# Patient Record
Sex: Male | Born: 1960 | Race: Black or African American | Hispanic: No | Marital: Single | Smoking: Current every day smoker
Health system: Southern US, Community
[De-identification: ages and names within clinical notes are randomized; demographics above are authoritative.]

## PROBLEM LIST (undated history)

## (undated) HISTORY — PX: KNEE ARTHROPLASTY: SHX992

## (undated) HISTORY — PX: FOOT FRACTURE SURGERY: SHX645

---

## 1998-10-23 ENCOUNTER — Emergency Department (HOSPITAL_COMMUNITY): Admission: EM | Admit: 1998-10-23 | Discharge: 1998-10-23 | Payer: Self-pay | Admitting: Emergency Medicine

## 1998-12-25 ENCOUNTER — Emergency Department (HOSPITAL_COMMUNITY): Admission: EM | Admit: 1998-12-25 | Discharge: 1998-12-25 | Payer: Self-pay | Admitting: Emergency Medicine

## 1999-12-30 ENCOUNTER — Emergency Department (HOSPITAL_COMMUNITY): Admission: EM | Admit: 1999-12-30 | Discharge: 1999-12-30 | Payer: Self-pay | Admitting: Emergency Medicine

## 2003-01-02 ENCOUNTER — Emergency Department (HOSPITAL_COMMUNITY): Admission: EM | Admit: 2003-01-02 | Discharge: 2003-01-02 | Payer: Self-pay | Admitting: *Deleted

## 2003-08-14 ENCOUNTER — Emergency Department (HOSPITAL_COMMUNITY): Admission: EM | Admit: 2003-08-14 | Discharge: 2003-08-14 | Payer: Self-pay | Admitting: Emergency Medicine

## 2003-09-20 ENCOUNTER — Emergency Department (HOSPITAL_COMMUNITY): Admission: EM | Admit: 2003-09-20 | Discharge: 2003-09-20 | Payer: Self-pay | Admitting: Emergency Medicine

## 2005-05-22 ENCOUNTER — Emergency Department (HOSPITAL_COMMUNITY): Admission: EM | Admit: 2005-05-22 | Discharge: 2005-05-22 | Payer: Self-pay | Admitting: Emergency Medicine

## 2006-11-11 ENCOUNTER — Emergency Department (HOSPITAL_COMMUNITY): Admission: EM | Admit: 2006-11-11 | Discharge: 2006-11-11 | Payer: Self-pay | Admitting: Emergency Medicine

## 2008-10-02 IMAGING — CT CT HEAD W/O CM
1 series · 16 of 30 positions shown, 20 images · IV contrast (agent unspecified)
Comparison: 09/20/03.

CLINICAL DATA: Severe headache.
 HEAD CT WITHOUT CONTRAST:
TECHNIQUE: Contiguous axial images were obtained from the base of the skull through the vertex according to standard protocol without contrast.

[Series 2: head_seq 4.5 h37s st · axial · 0.43mm/px · z∈[-648,-504]mm · 16 of 36 slices shown, 20 images]
[im 2/36  brain]
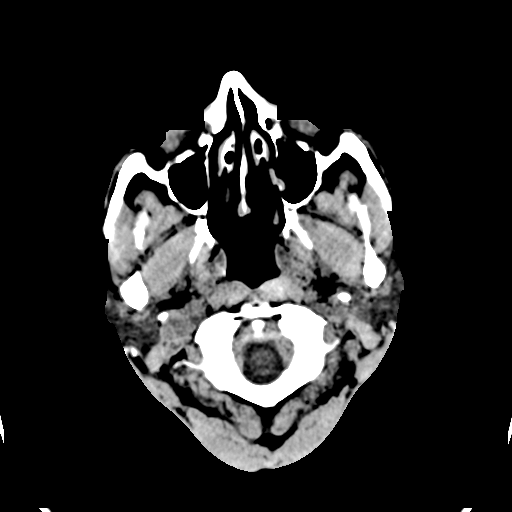
[im 2/36  bone]
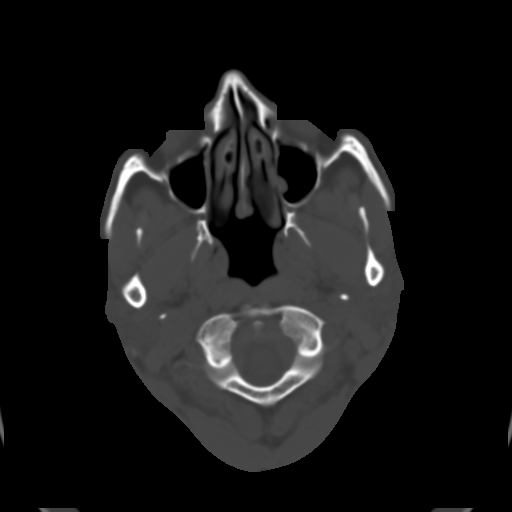
[im 4/36  brain]
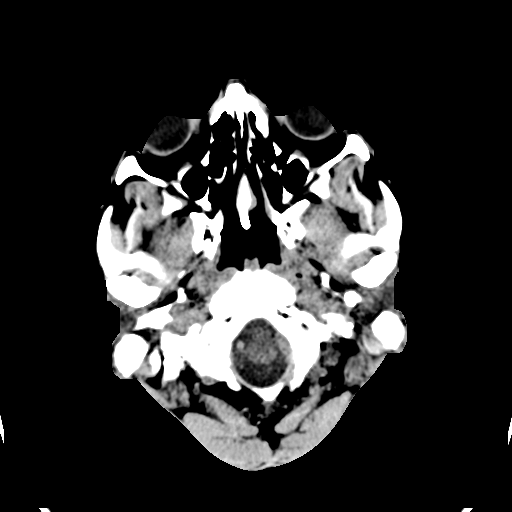
[im 7/36  brain]
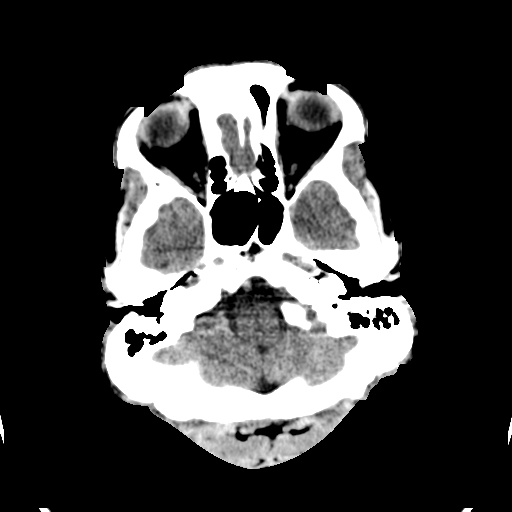
[im 9/36  brain]
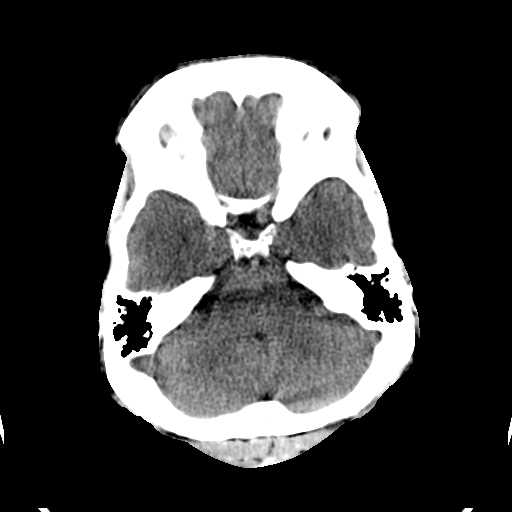
[im 10/36  brain]
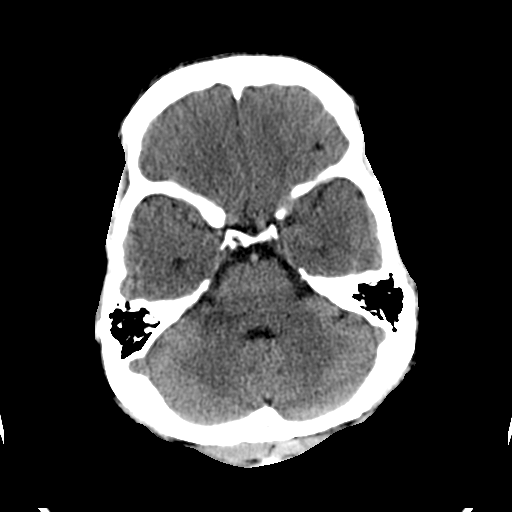
[im 10/36  bone]
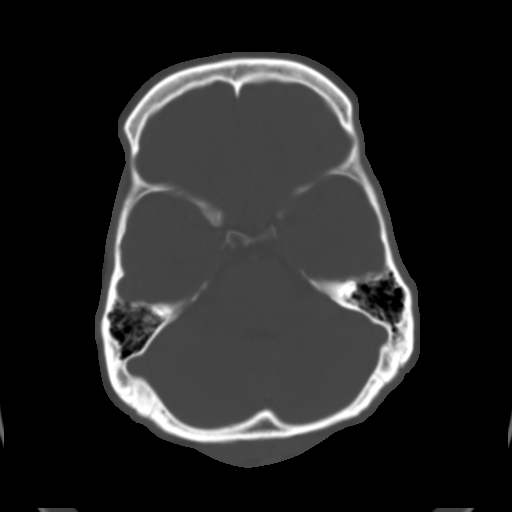
[im 13/36  brain]
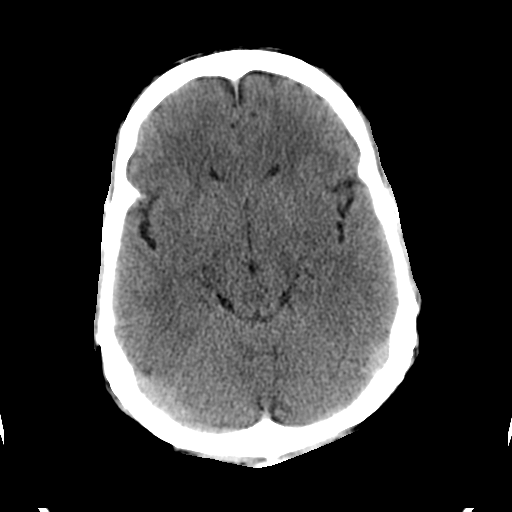
[im 15/36  brain]
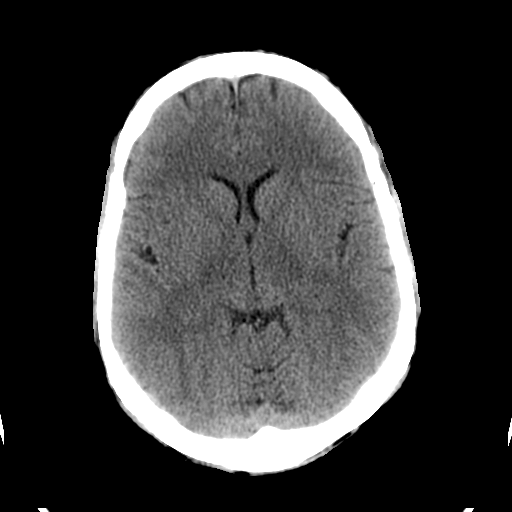
[im 17/36  brain]
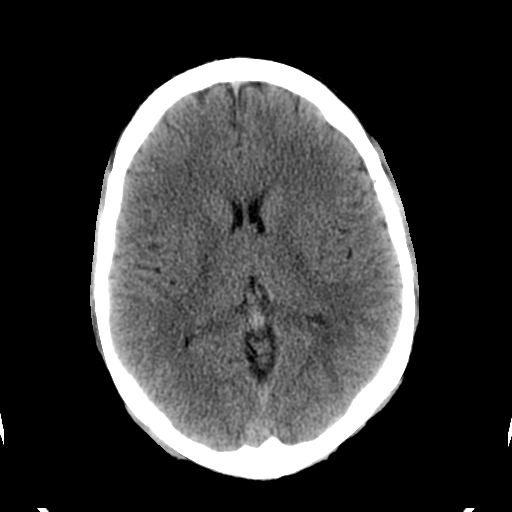
[im 19/36  brain]
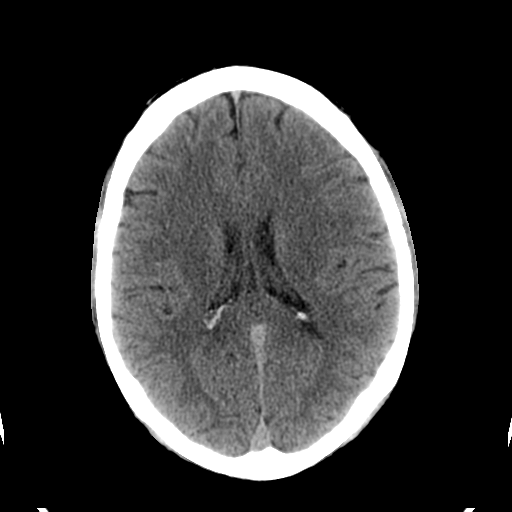
[im 19/36  bone]
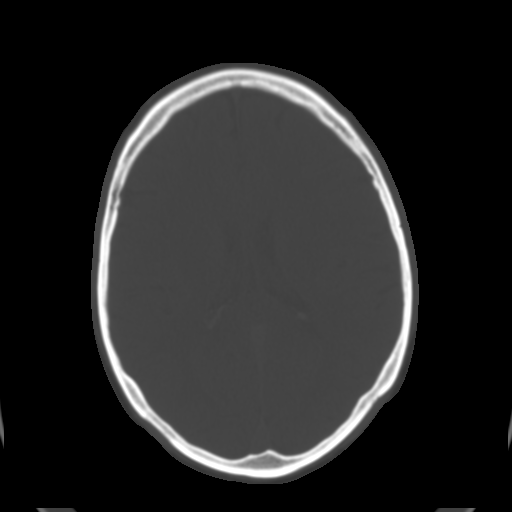
[im 21/36  brain]
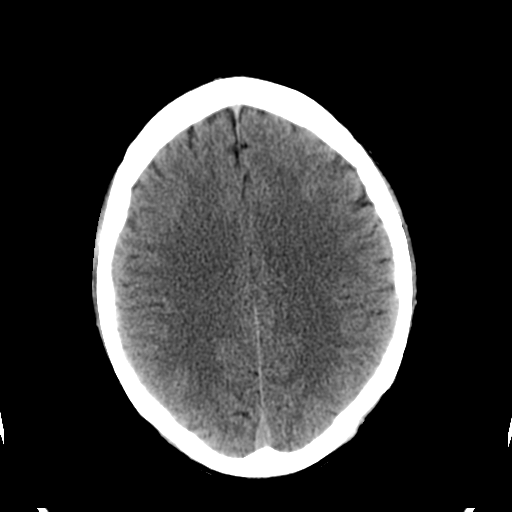
[im 23/36  brain]
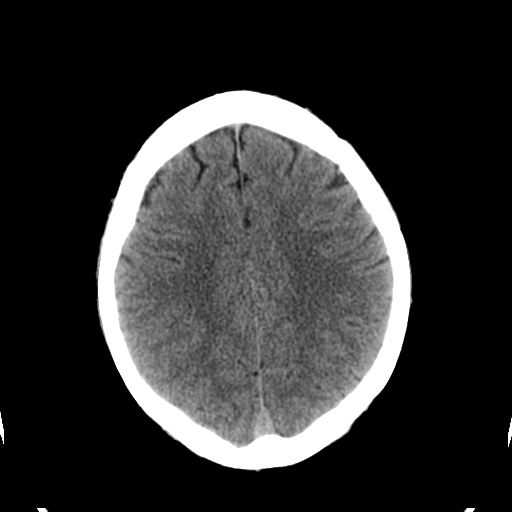
[im 26/36  brain]
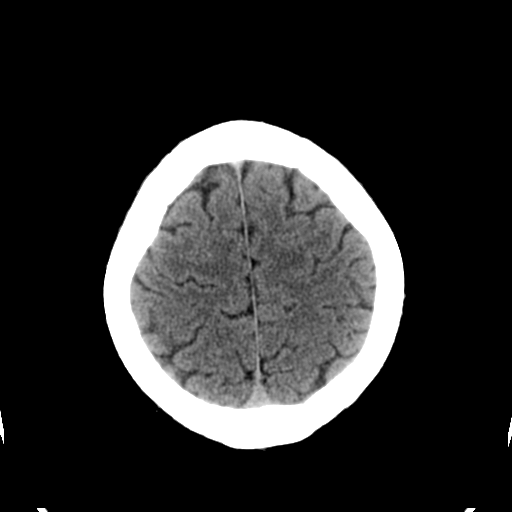
[im 27/36  brain]
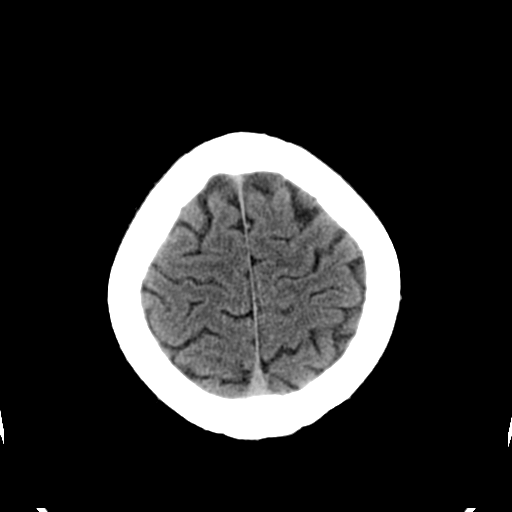
[im 27/36  bone]
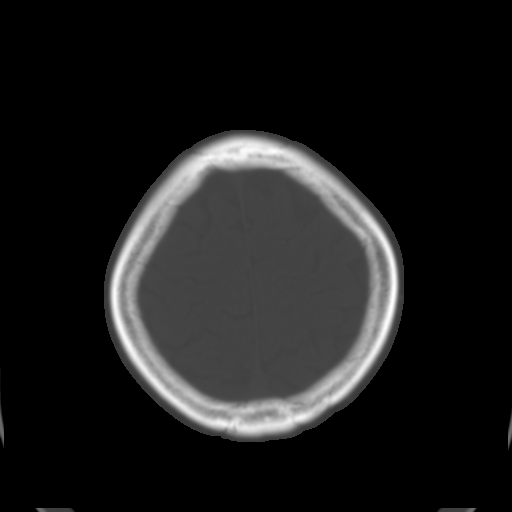
[im 29/36  brain]
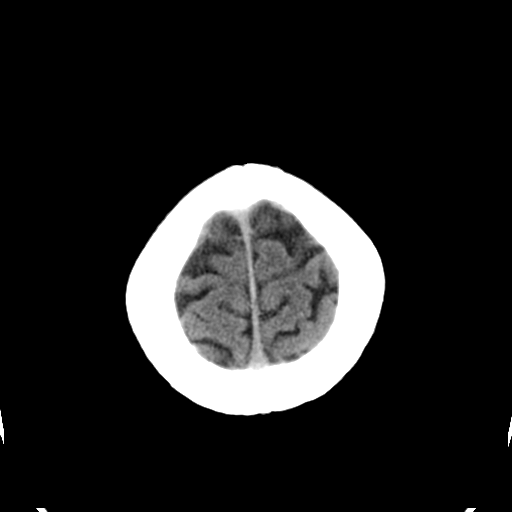
[im 32/36  brain]
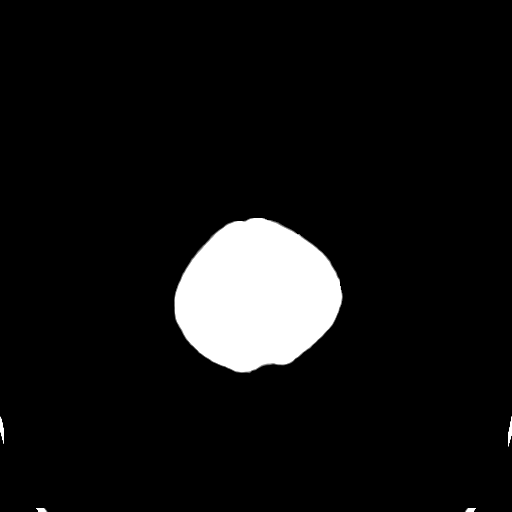
[im 34/36  brain]
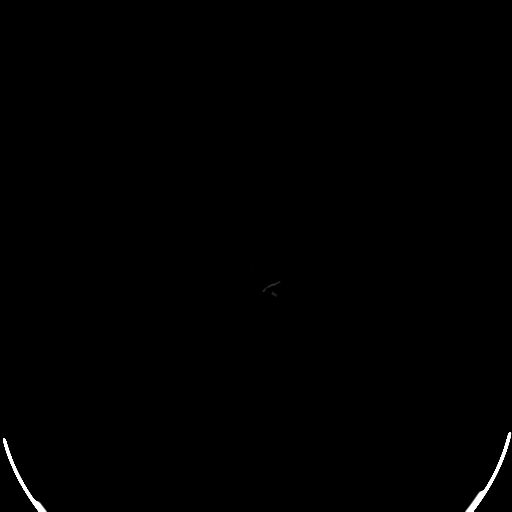

[16 of 30 positions shown; findings below may reference images not displayed]

FINDINGS: There is no evidence of intracranial hemorrhage, brain edema, acute infarct, mass lesion, or mass effect.  No other intra-axial abnormalities are seen, and the ventricles are within normal limits.  No abnormal extra-axial fluid collections or masses are identified.  No skull abnormalities are noted.
IMPRESSION: Negative non-contrast head CT.

## 2010-12-04 ENCOUNTER — Emergency Department (HOSPITAL_COMMUNITY): Payer: Self-pay

## 2010-12-04 ENCOUNTER — Emergency Department (HOSPITAL_COMMUNITY)
Admission: EM | Admit: 2010-12-04 | Discharge: 2010-12-04 | Disposition: A | Discharge status: Home / Self Care | Payer: Self-pay | Attending: Emergency Medicine | Admitting: Emergency Medicine

## 2010-12-04 DIAGNOSIS — IMO0002 Reserved for concepts with insufficient information to code with codable children: Secondary | ICD-10-CM | POA: Insufficient documentation

## 2010-12-04 DIAGNOSIS — S40029A Contusion of unspecified upper arm, initial encounter: Secondary | ICD-10-CM | POA: Insufficient documentation

## 2012-11-25 ENCOUNTER — Encounter (HOSPITAL_COMMUNITY): Payer: Self-pay | Admitting: Emergency Medicine

## 2012-11-25 ENCOUNTER — Emergency Department (HOSPITAL_COMMUNITY)
Admission: EM | Admit: 2012-11-25 | Discharge: 2012-11-25 | Disposition: A | Discharge status: Home / Self Care | Payer: Self-pay | Attending: Emergency Medicine | Admitting: Emergency Medicine

## 2012-11-25 ENCOUNTER — Emergency Department (HOSPITAL_COMMUNITY): Payer: Self-pay

## 2012-11-25 DIAGNOSIS — Y929 Unspecified place or not applicable: Secondary | ICD-10-CM | POA: Insufficient documentation

## 2012-11-25 DIAGNOSIS — IMO0002 Reserved for concepts with insufficient information to code with codable children: Secondary | ICD-10-CM | POA: Insufficient documentation

## 2012-11-25 DIAGNOSIS — F17200 Nicotine dependence, unspecified, uncomplicated: Secondary | ICD-10-CM | POA: Insufficient documentation

## 2012-11-25 DIAGNOSIS — S62309A Unspecified fracture of unspecified metacarpal bone, initial encounter for closed fracture: Secondary | ICD-10-CM | POA: Insufficient documentation

## 2012-11-25 DIAGNOSIS — S62308A Unspecified fracture of other metacarpal bone, initial encounter for closed fracture: Secondary | ICD-10-CM

## 2012-11-25 DIAGNOSIS — Y939 Activity, unspecified: Secondary | ICD-10-CM | POA: Insufficient documentation

## 2012-11-25 MED ORDER — OXYCODONE-ACETAMINOPHEN 5-325 MG PO TABS: 1.0000 | ORAL_TABLET | Freq: Four times a day (QID) | ORAL | Status: DC | PRN

## 2012-11-25 MED ORDER — OXYCODONE-ACETAMINOPHEN 5-325 MG PO TABS
2.0000 | ORAL_TABLET | Freq: Once | ORAL | Status: AC
  Administered 2012-11-25: 2 via ORAL
  Filled 2012-11-25: qty 2

## 2012-11-25 NOTE — ED Notes (Signed)
Called ortho tech to apply splint 

## 2012-11-25 NOTE — Progress Notes (Signed)
[facility] provided pt with a list of primary care resources.  °
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
## 2012-11-25 NOTE — ED Provider Notes (Signed)
Medical screening examination/treatment/procedure(s) were performed by non-physician practitioner and as supervising physician I was immediately available for consultation/collaboration.    Gilda Crease, MD 11/25/12 [phone]
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
## 2014-03-19 ENCOUNTER — Encounter (HOSPITAL_COMMUNITY): Payer: Self-pay | Admitting: Emergency Medicine

## 2014-03-19 ENCOUNTER — Emergency Department (HOSPITAL_COMMUNITY)
Admission: EM | Admit: 2014-03-19 | Discharge: 2014-03-19 | Disposition: A | Discharge status: Home / Self Care | Payer: Self-pay | Attending: Emergency Medicine | Admitting: Emergency Medicine

## 2014-03-19 DIAGNOSIS — Z720 Tobacco use: Secondary | ICD-10-CM | POA: Insufficient documentation

## 2014-03-19 DIAGNOSIS — H538 Other visual disturbances: Secondary | ICD-10-CM | POA: Insufficient documentation

## 2014-03-19 MED ORDER — PROPARACAINE HCL 0.5 % OP SOLN
1.0000 [drp] | Freq: Once | OPHTHALMIC | Status: AC
  Administered 2014-03-19: 1 [drp] via OPHTHALMIC
  Filled 2014-03-19: qty 15

## 2014-03-19 NOTE — ED Provider Notes (Signed)
CSN: [id number]     Arrival date & time 03/19/14  1426 History   First MD Initiated Contact with Patient 03/19/14 1513     Chief Complaint  Patient presents with  . Blurred Vision      HPI Patient reports of blurring of his left central vision over the past 24 hours.  Also feels like he sees an abnormal red color out of his left eye at times.  He doesn't describe specific floaters.  He does report that this began rather abruptly yesterday.  He has normal vision out of his right eye.  He denies a sensation of a black curtain coming across his eyelid.  He denies headache.  He has no eye pain.  No recent injury or trauma to his left eye.  He denies double vision.  He denies pain with extraocular movements of his left eye.  No recent discharge from his left eye.   History reviewed. No pertinent past medical history. Past Surgical History  Procedure Laterality Date  . Knee arthroplasty Left   . Foot fracture surgery Right    No family history on file. History  Substance Use Topics  . Smoking status: Current Every Day Smoker -- 1.00 packs/day    Types: Cigarettes  . Smokeless tobacco: Not on file  . Alcohol Use: Yes     Comment: occas    Review of Systems  All other systems reviewed and are negative.     Allergies  Bee venom  Home Medications   Prior to Admission medications   Medication Sig Start Date End Date Taking? Authorizing Provider  oxyCODONE-acetaminophen (PERCOCET/ROXICET) 5-325 MG per tablet Take 1 tablet by mouth every 6 (six) hours as needed for pain. Patient not taking: Reported on 03/19/2014 11/25/12   Roxy Horsemanobert Browning, PA-C   BP 111/72 mmHg  Pulse 87  Temp(Src) 98.2 F (36.8 C) (Oral)  Resp 16  SpO2 98% Physical Exam  Constitutional: He is oriented to person, place, and time. He appears well-developed and well-nourished.  HENT:  Head: Normocephalic.  Eyes: Conjunctivae and lids are normal. Pupils are equal, round, and reactive to light. Right eye exhibits  no chemosis, no discharge and no exudate. No foreign body present in the right eye. Left eye exhibits no chemosis, no discharge and no exudate. No foreign body present in the left eye. Right conjunctiva is not injected. Left conjunctiva is not injected. Right eye exhibits normal extraocular motion. Left eye exhibits normal extraocular motion.  Fundoscopic exam:      The right eye shows no hemorrhage.       The left eye shows no hemorrhage.  Intraocular pressure left eye is 10  Neck: Normal range of motion.  Pulmonary/Chest: Effort normal.  Abdominal: He exhibits no distension.  Musculoskeletal: Normal range of motion.  Neurological: He is alert and oriented to person, place, and time.  Psychiatric: He has a normal mood and affect.  Nursing note and vitals reviewed.   ED Course  Procedures (including critical care time) Labs Review Labs Reviewed - No data to display  Imaging Review No results found.   EKG Interpretation None      MDM   Final diagnoses:  Blurred vision, left eye   At this time am not convinced that this is a retinal detachment.  His extraocular pressures in his left eye are normal.  I do not think any emergent ophthalmologic procedures need to be completed today.  I have asked that he follow-up with the ophthalmologist tomorrow.  Lyanne Co, MD 03/19/14 1900

## 2014-03-19 NOTE — ED Notes (Signed)
Pt from home c/o left eye blurred vision. He reports that he has peripheral vision but looking straight head is blurry.

## 2016-06-08 ENCOUNTER — Encounter (HOSPITAL_COMMUNITY): Payer: Self-pay

## 2016-06-08 ENCOUNTER — Emergency Department (HOSPITAL_COMMUNITY): Payer: No Typology Code available for payment source

## 2016-06-08 ENCOUNTER — Emergency Department (HOSPITAL_COMMUNITY)
Admission: EM | Admit: 2016-06-08 | Discharge: 2016-06-08 | Disposition: A | Discharge status: Home / Self Care | Payer: No Typology Code available for payment source | Attending: Emergency Medicine | Admitting: Emergency Medicine

## 2016-06-08 DIAGNOSIS — Y999 Unspecified external cause status: Secondary | ICD-10-CM | POA: Insufficient documentation

## 2016-06-08 DIAGNOSIS — S299XXA Unspecified injury of thorax, initial encounter: Secondary | ICD-10-CM | POA: Insufficient documentation

## 2016-06-08 DIAGNOSIS — Z96652 Presence of left artificial knee joint: Secondary | ICD-10-CM | POA: Insufficient documentation

## 2016-06-08 DIAGNOSIS — S4991XA Unspecified injury of right shoulder and upper arm, initial encounter: Secondary | ICD-10-CM | POA: Diagnosis not present

## 2016-06-08 DIAGNOSIS — Y9389 Activity, other specified: Secondary | ICD-10-CM | POA: Insufficient documentation

## 2016-06-08 DIAGNOSIS — Y92009 Unspecified place in unspecified non-institutional (private) residence as the place of occurrence of the external cause: Secondary | ICD-10-CM | POA: Insufficient documentation

## 2016-06-08 DIAGNOSIS — F17210 Nicotine dependence, cigarettes, uncomplicated: Secondary | ICD-10-CM | POA: Insufficient documentation

## 2016-06-08 MED ORDER — OXYCODONE-ACETAMINOPHEN 5-325 MG PO TABS
ORAL_TABLET | ORAL | Status: AC
  Filled 2016-06-08: qty 1

## 2016-06-08 MED ORDER — METHOCARBAMOL 500 MG PO TABS: 500.0000 mg | ORAL_TABLET | Freq: Three times a day (TID) | ORAL | 0 refills | Status: DC | PRN

## 2016-06-08 MED ORDER — OXYCODONE-ACETAMINOPHEN 5-325 MG PO TABS
1.0000 | ORAL_TABLET | Freq: Once | ORAL | Status: AC
  Administered 2016-06-08: 1 via ORAL

## 2016-06-08 MED ORDER — IBUPROFEN 800 MG PO TABS: 800.0000 mg | ORAL_TABLET | Freq: Three times a day (TID) | ORAL | 0 refills | Status: DC

## 2016-06-08 NOTE — ED Notes (Signed)
Patient transported to X-ray 

## 2016-06-08 NOTE — Discharge Instructions (Signed)
Expect stiffness and soreness all over tomorrow. Ice to painful areas. Motrin for pain. Robaxin for muscle spasm or stiffness
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
## 2016-06-14 ENCOUNTER — Emergency Department (HOSPITAL_COMMUNITY)
Admission: EM | Admit: 2016-06-14 | Discharge: 2016-06-14 | Disposition: A | Discharge status: Home / Self Care | Payer: No Typology Code available for payment source | Attending: Emergency Medicine | Admitting: Emergency Medicine

## 2016-06-14 ENCOUNTER — Encounter (HOSPITAL_COMMUNITY): Payer: Self-pay | Admitting: Emergency Medicine

## 2016-06-14 ENCOUNTER — Emergency Department (HOSPITAL_COMMUNITY): Payer: No Typology Code available for payment source

## 2016-06-14 DIAGNOSIS — F17210 Nicotine dependence, cigarettes, uncomplicated: Secondary | ICD-10-CM | POA: Insufficient documentation

## 2016-06-14 DIAGNOSIS — S20219A Contusion of unspecified front wall of thorax, initial encounter: Secondary | ICD-10-CM | POA: Insufficient documentation

## 2016-06-14 DIAGNOSIS — Z96652 Presence of left artificial knee joint: Secondary | ICD-10-CM | POA: Insufficient documentation

## 2016-06-14 DIAGNOSIS — Y999 Unspecified external cause status: Secondary | ICD-10-CM | POA: Diagnosis not present

## 2016-06-14 DIAGNOSIS — Y939 Activity, unspecified: Secondary | ICD-10-CM | POA: Insufficient documentation

## 2016-06-14 DIAGNOSIS — T07XXXA Unspecified multiple injuries, initial encounter: Secondary | ICD-10-CM

## 2016-06-14 DIAGNOSIS — Z79899 Other long term (current) drug therapy: Secondary | ICD-10-CM | POA: Diagnosis not present

## 2016-06-14 DIAGNOSIS — Y92410 Unspecified street and highway as the place of occurrence of the external cause: Secondary | ICD-10-CM | POA: Insufficient documentation

## 2016-06-14 DIAGNOSIS — S299XXA Unspecified injury of thorax, initial encounter: Secondary | ICD-10-CM | POA: Diagnosis present

## 2016-06-14 MED ORDER — CYCLOBENZAPRINE HCL 10 MG PO TABS: 10.0000 mg | ORAL_TABLET | Freq: Two times a day (BID) | ORAL | 0 refills | Status: DC | PRN
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
## 2016-06-14 NOTE — Discharge Instructions (Addendum)
Flexiril, a muscle relaxant, as needed for muscle pain.
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
## 2020-02-16 ENCOUNTER — Ambulatory Visit: Payer: Self-pay | Attending: Internal Medicine

## 2020-02-16 DIAGNOSIS — Z23 Encounter for immunization: Secondary | ICD-10-CM

## 2020-02-16 NOTE — Progress Notes (Signed)
   Covid-19 Vaccination Clinic  Name:  Nicholas Travis    MRN: [id number] DOB: [birth year]  02/16/2020  [name] was observed post Covid-19 immunization for 15 minutes without incident. He was provided with Vaccine Information Sheet and instruction to access the V-Safe system.   [name] was instructed to call 911 with any severe reactions post vaccine: Marland Kitchen Difficulty breathing  . Swelling of face and throat  . A fast heartbeat  . A bad rash all over body  . Dizziness and weakness   Immunizations Administered    Name Date Dose VIS Date Route   Pfizer COVID-19 Vaccine 02/16/2020  2:52 PM 0.3 mL 12/14/2019 Intramuscular   Manufacturer: [employer], [employer]   Lot: [id number]   NDC: [address]-3

## 2022-07-31 ENCOUNTER — Other Ambulatory Visit: Payer: Self-pay

## 2022-07-31 ENCOUNTER — Inpatient Hospital Stay (HOSPITAL_COMMUNITY)
Admission: EM | Admit: 2022-07-31 | Discharge: 2022-08-01 | DRG: 194 | Disposition: A | Discharge status: Home / Self Care | Payer: Self-pay | Attending: Internal Medicine | Admitting: Internal Medicine

## 2022-07-31 ENCOUNTER — Emergency Department (HOSPITAL_COMMUNITY): Payer: Self-pay

## 2022-07-31 DIAGNOSIS — Z1152 Encounter for screening for COVID-19: Secondary | ICD-10-CM

## 2022-07-31 DIAGNOSIS — J189 Pneumonia, unspecified organism: Principal | ICD-10-CM | POA: Diagnosis present

## 2022-07-31 DIAGNOSIS — F1210 Cannabis abuse, uncomplicated: Secondary | ICD-10-CM | POA: Diagnosis present

## 2022-07-31 DIAGNOSIS — F1010 Alcohol abuse, uncomplicated: Secondary | ICD-10-CM | POA: Diagnosis present

## 2022-07-31 DIAGNOSIS — J470 Bronchiectasis with acute lower respiratory infection: Secondary | ICD-10-CM | POA: Diagnosis present

## 2022-07-31 DIAGNOSIS — Z91030 Bee allergy status: Secondary | ICD-10-CM

## 2022-07-31 DIAGNOSIS — F1410 Cocaine abuse, uncomplicated: Secondary | ICD-10-CM | POA: Diagnosis present

## 2022-07-31 DIAGNOSIS — F1990 Other psychoactive substance use, unspecified, uncomplicated: Secondary | ICD-10-CM

## 2022-07-31 DIAGNOSIS — Z96652 Presence of left artificial knee joint: Secondary | ICD-10-CM | POA: Diagnosis present

## 2022-07-31 DIAGNOSIS — F17210 Nicotine dependence, cigarettes, uncomplicated: Secondary | ICD-10-CM | POA: Diagnosis present

## 2022-07-31 DIAGNOSIS — E860 Dehydration: Secondary | ICD-10-CM | POA: Diagnosis present

## 2022-07-31 LAB — LIPASE, BLOOD: Lipase: 38 U/L (ref 11–51)

## 2022-07-31 LAB — URINALYSIS, ROUTINE W REFLEX MICROSCOPIC
Bacteria, UA: NONE SEEN
Bilirubin Urine: NEGATIVE
Glucose, UA: NEGATIVE mg/dL
Ketones, ur: NEGATIVE mg/dL
Leukocytes,Ua: NEGATIVE
Nitrite: NEGATIVE
Protein, ur: NEGATIVE mg/dL
Specific Gravity, Urine: 1.021 (ref 1.005–1.030)
pH: 5 (ref 5.0–8.0)

## 2022-07-31 LAB — COMPREHENSIVE METABOLIC PANEL
ALT: 10 U/L (ref 0–44)
AST: 11 U/L — ABNORMAL LOW (ref 15–41)
Albumin: 3.4 g/dL — ABNORMAL LOW (ref 3.5–5.0)
Alkaline Phosphatase: 76 U/L (ref 38–126)
Anion gap: 8 (ref 5–15)
BUN: 10 mg/dL (ref 6–20)
CO2: 23 mmol/L (ref 22–32)
Calcium: 8.6 mg/dL — ABNORMAL LOW (ref 8.9–10.3)
Chloride: 103 mmol/L (ref 98–111)
Creatinine, Ser: 1.11 mg/dL (ref 0.61–1.24)
GFR, Estimated: >60 mL/min (ref >60)
Glucose, Bld: 137 mg/dL — ABNORMAL HIGH (ref 70–99)
Potassium: 3.8 mmol/L (ref 3.5–5.1)
Sodium: 134 mmol/L — ABNORMAL LOW (ref 135–145)
Total Bilirubin: 1.1 mg/dL (ref 0.3–1.2)
Total Protein: 6.9 g/dL (ref 6.5–8.1)

## 2022-07-31 LAB — CBC
HCT: 43.6 % (ref 39.0–52.0)
Hemoglobin: 14.8 g/dL (ref 13.0–17.0)
MCH: 31.2 pg (ref 26.0–34.0)
MCHC: 33.9 g/dL (ref 30.0–36.0)
MCV: 91.8 fL (ref 80.0–100.0)
Platelets: 239 10*3/uL (ref 150–400)
RBC: 4.75 MIL/uL (ref 4.22–5.81)
RDW: 12.8 % (ref 11.5–15.5)
WBC: 18.7 10*3/uL — ABNORMAL HIGH (ref 4.0–10.5)
nRBC: 0 % (ref 0.0–0.2)

## 2022-07-31 LAB — SARS CORONAVIRUS 2 BY RT PCR: SARS Coronavirus 2 by RT PCR: NEGATIVE

## 2022-07-31 LAB — LACTIC ACID, PLASMA
Lactic Acid, Venous: 2 mmol/L (ref 0.5–1.9)
Lactic Acid, Venous: 1.2 mmol/L (ref 0.5–1.9)

## 2022-07-31 LAB — STREP PNEUMONIAE URINARY ANTIGEN: Strep Pneumo Urinary Antigen: NEGATIVE

## 2022-07-31 MED ORDER — SODIUM CHLORIDE 0.9 % IV SOLN
500.0000 mg | Freq: Once | INTRAVENOUS | Status: AC
  Administered 2022-07-31: 500 mg via INTRAVENOUS
  Filled 2022-07-31: qty 5

## 2022-07-31 MED ORDER — ACETAMINOPHEN 325 MG PO TABS
650.0000 mg | ORAL_TABLET | Freq: Four times a day (QID) | ORAL | Status: DC | PRN
  Administered 2022-08-01 (×2): 650 mg via ORAL
  Filled 2022-07-31 (×2): qty 2

## 2022-07-31 MED ORDER — ALBUTEROL SULFATE (2.5 MG/3ML) 0.083% IN NEBU: 2.5000 mg | INHALATION_SOLUTION | RESPIRATORY_TRACT | Status: DC | PRN

## 2022-07-31 MED ORDER — IOHEXOL 350 MG/ML SOLN
75.0000 mL | Freq: Once | INTRAVENOUS | Status: AC | PRN
  Administered 2022-07-31: 75 mL via INTRAVENOUS

## 2022-07-31 MED ORDER — GUAIFENESIN ER 600 MG PO TB12
600.0000 mg | ORAL_TABLET | Freq: Two times a day (BID) | ORAL | Status: DC
  Administered 2022-07-31 – 2022-08-01 (×2): 600 mg via ORAL
  Filled 2022-07-31 (×2): qty 1

## 2022-07-31 MED ORDER — ONDANSETRON HCL 4 MG PO TABS: 4.0000 mg | ORAL_TABLET | Freq: Four times a day (QID) | ORAL | Status: DC | PRN

## 2022-07-31 MED ORDER — SODIUM CHLORIDE 0.9 % IV BOLUS
1000.0000 mL | Freq: Once | INTRAVENOUS | Status: AC
  Administered 2022-07-31: 1000 mL via INTRAVENOUS

## 2022-07-31 MED ORDER — LACTATED RINGERS IV BOLUS
1000.0000 mL | Freq: Once | INTRAVENOUS | Status: AC
  Administered 2022-07-31: 1000 mL via INTRAVENOUS

## 2022-07-31 MED ORDER — ONDANSETRON HCL 4 MG/2ML IJ SOLN
4.0000 mg | Freq: Once | INTRAMUSCULAR | Status: AC
  Administered 2022-07-31: 4 mg via INTRAVENOUS
  Filled 2022-07-31: qty 2

## 2022-07-31 MED ORDER — SODIUM CHLORIDE 0.9 % IV SOLN
1.0000 g | Freq: Once | INTRAVENOUS | Status: AC
  Administered 2022-07-31: 1 g via INTRAVENOUS
  Filled 2022-07-31: qty 10

## 2022-07-31 MED ORDER — SENNOSIDES-DOCUSATE SODIUM 8.6-50 MG PO TABS: 1.0000 | ORAL_TABLET | Freq: Every evening | ORAL | Status: DC | PRN

## 2022-07-31 MED ORDER — ENOXAPARIN SODIUM 40 MG/0.4ML IJ SOSY
40.0000 mg | PREFILLED_SYRINGE | INTRAMUSCULAR | Status: DC
  Administered 2022-07-31: 40 mg via SUBCUTANEOUS
  Filled 2022-07-31: qty 0.4

## 2022-07-31 MED ORDER — ONDANSETRON 4 MG PO TBDP
4.0000 mg | ORAL_TABLET | Freq: Once | ORAL | Status: AC
  Administered 2022-07-31: 4 mg via ORAL
  Filled 2022-07-31: qty 1

## 2022-07-31 MED ORDER — SODIUM CHLORIDE 0.9 % IV SOLN: 500.0000 mg | INTRAVENOUS | Status: DC

## 2022-07-31 MED ORDER — SODIUM CHLORIDE 0.9 % IV SOLN
2.0000 g | INTRAVENOUS | Status: DC
  Administered 2022-08-01: 2 g via INTRAVENOUS
  Filled 2022-07-31: qty 20

## 2022-07-31 MED ORDER — ONDANSETRON HCL 4 MG/2ML IJ SOLN: 4.0000 mg | Freq: Four times a day (QID) | INTRAMUSCULAR | Status: DC | PRN

## 2022-07-31 MED ORDER — ACETAMINOPHEN 650 MG RE SUPP: 650.0000 mg | Freq: Four times a day (QID) | RECTAL | Status: DC | PRN

## 2022-07-31 MED ORDER — LACTATED RINGERS IV SOLN: INTRAVENOUS | Status: AC

## 2022-07-31 NOTE — ED Provider Notes (Signed)
Care of patient received from prior provider at 7:24 PM, please see their note for complete H/P and care plan.  Received handoff per ED course.  Clinical Course as of 07/31/22 1924  Thu Jul 31, 2022  1614 Stable  47 YOM with  a chief complaint of fever, abdominal pain. CT done. WBC 18 Follow up CTAP [CC]    Clinical Course User Index [CC] Glyn Ade, MD   CRITICAL CARE Performed by: Glyn Ade  ?  Total critical care time: 30 minutes for sepsis  Critical care time was exclusive of separately billable procedures and treating other patients.  Critical care was necessary to treat or prevent imminent or life-threatening deterioration.  Critical care was time spent personally by me on the following activities: development of treatment plan with patient and/or surrogate as well as nursing, discussions with consultants, evaluation of patient's response to treatment, examination of patient, obtaining history from patient or surrogate, ordering and performing treatments and interventions, ordering and review of laboratory studies, ordering and review of radiographic studies, pulse oximetry and re-evaluation of patient's condition.   Reassessment: On reassessment, patient has a right lower lobar pneumonia unanticipated on his CT abdomen pelvis.  At the same time his lactic acid resulted positive at 2.0.  May be simple dehydration but I am concerned for underlying pneumonia with potential for septicemia given positive SIRS.  Treated with IV fluids, antibiotics and consulted with hospitalist for more definitive care and management.  Patient was very weak on my reassessment unable to ambulate due to fatigue and cough.  He does not have any active dyspnea at this time. Patient arranged for admission for further care and management.     Glyn Ade, MD 07/31/22 1925

## 2022-07-31 NOTE — H&P (Signed)
History and Physical    Nicholas Travis ZOX:[id number] DOB: 1960/03/08 DOA: 07/31/2022  PCP: Patient, No Pcp Per  Patient coming from: Home  I have personally briefly reviewed patient's old medical records in [facility]  Chief Complaint: Fevers, chills, malaise  HPI: Nicholas Travis is a 62 y.o. male with medical history significant for alcohol and tobacco use who presented to the ED for evaluation of fevers, chills, malaise.  Patient reports 5 days of fevers, chills, fatigue, poor appetite with poor oral intake.  He has been feeling dehydrated.  He reports intermittent cough productive of yellow sputum which is increased from baseline.  He has not been active since symptom onset so has not appreciated any shortness of breath with activity.  He denies chest pain.  He has had some nausea and vomiting.  He denies any aspiration events.  He denies any abdominal pain, diarrhea, dysuria, swelling of the legs.  He reports smoking about half pack per day of cigarettes.  He reports using cocaine and marijuana, last use of both was 1 week ago prior to his symptoms.  He reports seldom alcohol use, none recently.  He denies injection drug use.  ED Course  Labs/Imaging on admission: I have personally reviewed following labs and imaging studies.  Initial vitals showed BP 130/88, pulse 112, RR 20, temp 100.6 F, SpO2 97% on room air.  Labs show WBC 18.7, hemoglobin 14.8, platelets 239,000, sodium 134, potassium 3.8, bicarb 23, BUN 10, creatinine 1.11, serum glucose 137, AST 11, ALT 10, flat 76, total bilirubin 1.1, lactic acid 2.0 > 1.2.  Urinalysis negative for UTI.  Blood cultures collected and pending.  CT abdomen/pelvis with contrast shows mild mural thickening of the gastric antrum with subjective mucosal hyperenhancement which can be seen in the setting of gastritis.  Mild mural thickening of the ascending colon without substantial pericolonic stranding noted.  Bilateral lower lobe  bronchiectasis with multifocal groundglass opacities and right middle lobe consolidation seen.  Moderate luminal narrowing of the left common iliac artery due to noncalcified plaque also noted.  Patient was given 1 L LR, 1 L normal saline, Zofran, IV ceftriaxone and azithromycin.  The hospitalist service was consulted to admit for further evaluation and management.  Review of Systems: All systems reviewed and are negative except as documented in history of present illness above.   No past medical history on file.  Past Surgical History:  Procedure Laterality Date   FOOT FRACTURE SURGERY Right    KNEE ARTHROPLASTY Left     Social History:  reports that he has been smoking cigarettes. He has been smoking an average of 1 pack per day. He has never used smokeless tobacco. He reports current alcohol use. He reports current drug use. Drug: Marijuana.  Allergies  Allergen Reactions   Bee Venom Swelling    No family history on file.   Prior to Admission medications   Medication Sig Start Date End Date Taking? Authorizing Provider  cyclobenzaprine (FLEXERIL) 10 MG tablet Take 1 tablet (10 mg total) by mouth 2 (two) times daily as needed for muscle spasms. 06/14/16   Rolland Porter, MD  ibuprofen (ADVIL,MOTRIN) 800 MG tablet Take 1 tablet (800 mg total) by mouth 3 (three) times daily. 06/08/16   Rolland Porter, MD  methocarbamol (ROBAXIN) 500 MG tablet Take 1 tablet (500 mg total) by mouth 3 (three) times daily between meals as needed. 06/08/16   Rolland Porter, MD  oxyCODONE-acetaminophen (PERCOCET/ROXICET) 5-325 MG per tablet Take 1  tablet by mouth every 6 (six) hours as needed for pain. Patient not taking: Reported on 03/19/2014 11/25/12   Roxy Horseman, PA-C    Physical Exam: Vitals:   07/31/22 1500 07/31/22 1659 07/31/22 1700 07/31/22 1900  BP: 122/81  (!) 120/97 126/87  Pulse: 94  92 84  Resp: (!) 21  (!) 24 15  Temp:  98.7 F (37.1 C)    TempSrc:  Oral    SpO2: 98%  98% 98%  Weight:       Height:       Constitutional: Resting in bed, NAD, calm, comfortable Eyes: EOMI, lids and conjunctivae normal ENMT: Mucous membranes are moist. Posterior pharynx clear of any exudate or lesions.Normal dentition.  Neck: normal, supple, no masses. Respiratory: Inspiratory crackles right middle lung field. Normal respiratory effort. No accessory muscle use.  Cardiovascular: Regular rate and rhythm, no murmurs / rubs / gallops. No extremity edema. 2+ pedal pulses. Abdomen: no tenderness, no masses palpated. Musculoskeletal: no clubbing / cyanosis. No joint deformity upper and lower extremities. Good ROM, no contractures. Normal muscle tone.  Skin: no rashes, lesions, ulcers. No induration Neurologic: Sensation intact. Strength 5/5 in all 4.  Psychiatric: Alert and oriented x 3. Normal mood.   EKG: Not performed.  Assessment/Plan Principal Problem:   Community acquired pneumonia of right middle lobe of lung Active Problems:   Polysubstance use disorder    Nicholas Travis is a 62 y.o. male with medical history significant for alcohol and tobacco use who is admitted with community-acquired right middle lobe pneumonia.  Assessment and Plan: Community-acquired right middle lobe pneumonia: 5 days of symptoms presenting with leukocytosis and imaging suggestive of RML pneumonia.  He is dehydrated due to poor oral intake.  He denies any aspiration. -Continue IV ceftriaxone and azithromycin -Follow blood cultures, strep pneumonia urinary antigen -IV fluid hydration overnight -Supplemental O2 as needed -IS, FV, Mucinex, bronchodilators as needed -CXR in a.m.  Polysubstance use: He reports smoking 0.5 PPD, he declines nicotine patch.  Admits to cocaine and marijuana use, last use 1 week ago.  He reports occasional alcohol use, none since symptom onset.  No sign of withdrawal on admission.   DVT prophylaxis: enoxaparin (LOVENOX) injection 40 mg Start: 07/31/22 2000 Code Status: Full code,  confirmed with patient on admission Family Communication: Discussed with patient, he has discussed with family Disposition Plan: From home and likely discharge to home pending clinical progress Consults called: None Severity of Illness: The appropriate patient status for this patient is INPATIENT. Inpatient status is judged to be reasonable and necessary in order to provide the required intensity of service to ensure the patient's safety. The patient's presenting symptoms, physical exam findings, and initial radiographic and laboratory data in the context of their chronic comorbidities is felt to place them at high risk for further clinical deterioration. Furthermore, it is not anticipated that the patient will be medically stable for discharge from the hospital within 2 midnights of admission.   * I certify that at the point of admission it is my clinical judgment that the patient will require inpatient hospital care spanning beyond 2 midnights from the point of admission due to high intensity of service, high risk for further deterioration and high frequency of surveillance required.Nicholas Mclean MD [facility]  If 7PM-7AM, please contact night-coverage [email or web address]  07/31/2022, 8:06 PM

## 2022-07-31 NOTE — ED Notes (Signed)
ED TO INPATIENT HANDOFF REPORT  ED Nurse Name and Phone #:   S Name/Age/Gender Nicholas Travis 62 y.o. male Room/Bed: 009C/009C  Code Status   Code Status: Full Code  Home/SNF/Other Home Patient oriented to: self, place, time, and situation Is this baseline? Yes   Triage Complete: Triage complete  Chief Complaint Sepsis due to pneumonia (HCC) [J18.9, A41.9] Community acquired pneumonia of right middle lobe of lung [J18.9]  Triage Note Wife stated, he's been having N/V for 4 to 5 days ago.We had a gas leak last week so I'm not sure if that is part of it.   Allergies Allergies  Allergen Reactions   Bee Venom Swelling    Level of Care/Admitting Diagnosis ED Disposition     ED Disposition  Admit   Condition  --   Comment  [facility]ospital [facility] [[id number]]  Level of Care: Med-Surg [16]  May admit patient to [name] or [name] if equivalent level of care is available:: No  Covid Evaluation: Symptomatic Person Under Investigation (PUI) or recent exposure (last 10 days) *Testing Required*  Diagnosis: Community acquired pneumonia of right middle lobe of lung [[id number]]  Admitting Physician: Charlsie Quest [[id number]]  Attending Physician: Charlsie Quest [[id number]]  Certification:: I certify this patient will need inpatient services for at least 2 midnights          B Medical/Surgery History No past medical history on file. Past Surgical History:  Procedure Laterality Date   FOOT FRACTURE SURGERY Right    KNEE ARTHROPLASTY Left      A IV Location/Drains/Wounds Patient Lines/Drains/Airways Status     Active Line/Drains/Airways     Name Placement date Placement time Site Days   Peripheral IV 07/31/22 20 G Anterior;Right Forearm 07/31/22  1433  Forearm  less than 1   Peripheral IV 07/31/22 20 G Anterior;Left;Proximal Forearm 07/31/22  1858  Forearm  less than 1            Intake/Output Last 24 hours  Intake/Output Summary  (Last 24 hours) at 07/31/2022 2243 Last data filed at 07/31/2022 1605 Gross per 24 hour  Intake 1000 ml  Output --  Net 1000 ml    Labs/Imaging Results for orders placed or performed during the hospital encounter of 07/31/22 (from the past 48 hour(s))  Lipase, blood     Status: None   Collection Time: 07/31/22 11:02 AM  Result Value Ref Range   Lipase 38 11 - 51 U/L    Comment: Performed at [facility], [address], [place], [place] [address]  Comprehensive metabolic panel     Status: Abnormal   Collection Time: 07/31/22 11:02 AM  Result Value Ref Range   Sodium 134 (L) 135 - 145 mmol/L   Potassium 3.8 3.5 - 5.1 mmol/L   Chloride 103 98 - 111 mmol/L   CO2 23 22 - 32 mmol/L   Glucose, Bld 137 (H) 70 - 99 mg/dL    Comment: Glucose reference range applies only to samples taken after fasting for at least 8 hours.   BUN 10 6 - 20 mg/dL   Creatinine, Ser 3.08 0.61 - 1.24 mg/dL   Calcium 8.6 (L) 8.9 - 10.3 mg/dL   Total Protein 6.9 6.5 - 8.1 g/dL   Albumin 3.4 (L) 3.5 - 5.0 g/dL   AST 11 (L) 15 - 41 U/L   ALT 10 0 - 44 U/L   Alkaline Phosphatase 76 38 - 126 U/L  Total Bilirubin 1.1 0.3 - 1.2 mg/dL   GFR, Estimated >16 >10 mL/min    Comment: (NOTE) Calculated using the CKD-EPI Creatinine Equation (2021)    Anion gap 8 5 - 15    Comment: Performed at [facility], [address] [place], [place] [address]  CBC     Status: Abnormal   Collection Time: 07/31/22 11:02 AM  Result Value Ref Range   WBC 18.7 (H) 4.0 - 10.5 K/uL   RBC 4.75 4.22 - 5.81 MIL/uL   Hemoglobin 14.8 13.0 - 17.0 g/dL   HCT 40.9 81.1 - 91.4 %   MCV 91.8 80.0 - 100.0 fL   MCH 31.2 26.0 - 34.0 pg   MCHC 33.9 30.0 - 36.0 g/dL   RDW 78.2 95.6 - 21.3 %   Platelets 239 150 - 400 K/uL   nRBC 0.0 0.0 - 0.2 %    Comment: Performed at [facility], [address], [place], [place] [address]  Urinalysis, Routine w reflex microscopic -Urine, Clean Catch     Status: Abnormal   Collection Time:  07/31/22  2:33 PM  Result Value Ref Range   Color, Urine AMBER (A) YELLOW    Comment: BIOCHEMICALS MAY BE AFFECTED BY COLOR   APPearance CLEAR CLEAR   Specific Gravity, Urine 1.021 1.005 - 1.030   pH 5.0 5.0 - 8.0   Glucose, UA NEGATIVE NEGATIVE mg/dL   Hgb urine dipstick MODERATE (A) NEGATIVE   Bilirubin Urine NEGATIVE NEGATIVE   Ketones, ur NEGATIVE NEGATIVE mg/dL   Protein, ur NEGATIVE NEGATIVE mg/dL   Nitrite NEGATIVE NEGATIVE   Leukocytes,Ua NEGATIVE NEGATIVE   RBC / HPF 11-20 0 - 5 RBC/hpf   WBC, UA 0-5 0 - 5 WBC/hpf   Bacteria, UA NONE SEEN NONE SEEN   Squamous Epithelial / HPF 0-5 0 - 5 /HPF   Mucus PRESENT     Comment: Performed at [facility], [address], [place], [place] [address]  Lactic acid, plasma     Status: Abnormal   Collection Time: 07/31/22  2:46 PM  Result Value Ref Range   Lactic Acid, Venous 2.0 (HH) 0.5 - 1.9 mmol/L    Comment: CRITICAL RESULT CALLED TO, READ BACK BY AND VERIFIED WITH Ronalee Red, RN AT [phone] 06.06.24 D. BLU Performed at [facility], [address] [place], [place] [address]   Lactic acid, plasma     Status: None   Collection Time: 07/31/22  4:12 PM  Result Value Ref Range   Lactic Acid, Venous 1.2 0.5 - 1.9 mmol/L    Comment: Performed at [facility], [address] [place], [place] [address]  Blood culture (routine x 2)     Status: None (Preliminary result)   Collection Time: 07/31/22  7:00 PM   Specimen: BLOOD LEFT FOREARM  Result Value Ref Range   Specimen Description BLOOD LEFT FOREARM    Special Requests      BOTTLES DRAWN AEROBIC AND ANAEROBIC Blood Culture adequate volume Performed at [facility], [address], [place], [place] [address]    Culture PENDING    Report Status PENDING   Strep pneumoniae urinary antigen     Status: None   Collection Time: 07/31/22  7:53 PM  Result Value Ref Range   Strep Pneumo Urinary Antigen NEGATIVE NEGATIVE    Comment:        Infection due to S.  pneumoniae cannot be absolutely ruled out  since the antigen present may be below the detection limit of the test. Performed at [facility], [address] [place], [place] [address]   SARS Coronavirus 2 by RT PCR (hospital order, performed in [facility] hospital lab) *cepheid single result test* Anterior Nasal Swab     Status: None   Collection Time: 07/31/22  7:58 PM   Specimen: Anterior Nasal Swab  Result Value Ref Range   SARS Coronavirus 2 by RT PCR NEGATIVE NEGATIVE    Comment: Performed at [facility], [address] [place], [place] [address]   CT ABDOMEN PELVIS W CONTRAST  Result Date: 07/31/2022 CLINICAL DATA:  Right lower quadrant abdominal pain associated with 4-5 day history of nausea and vomiting EXAM: CT ABDOMEN AND PELVIS WITH CONTRAST TECHNIQUE: Multidetector CT imaging of the abdomen and pelvis was performed using the standard protocol following bolus administration of intravenous contrast. RADIATION DOSE REDUCTION: This exam was performed according to the departmental dose-optimization program which includes automated exposure control, adjustment of the mA and/or kV according to patient size and/or use of iterative reconstruction technique. CONTRAST:  75mL OMNIPAQUE IOHEXOL 350 MG/ML SOLN COMPARISON:  None Available. FINDINGS: Lower chest: Bilateral lower lobe bronchiectasis with multifocal ground-glass opacities and right middle lobe consolidation. No pleural effusion or pneumothorax demonstrated. Partially imaged heart size is normal. Hepatobiliary: No focal hepatic lesions. No intra or extrahepatic biliary ductal dilation. Gallbladder is contracted. Pancreas: No focal lesions or main ductal dilation. Spleen: Normal in size without focal abnormality. Adrenals/Urinary Tract: No adrenal nodules. No suspicious renal mass, calculi or hydronephrosis. Subcentimeter left renal hypodensities, too small to characterize, but likely cysts. No specific follow-up imaging  recommended. No focal bladder wall thickening. Stomach/Bowel: Mild mural thickening of the gastric antrum with subjective mucosal hyperenhancement. Apparent mild mural thickening of the ascending colon without substantial pericolonic stranding. No abnormal bowel distention. Diverticulum along the ascending colon. Normal appendix. Vascular/Lymphatic: Aortic atherosclerosis. Moderate luminal narrowing of the left common iliac artery due to noncalcified plaque. No enlarged abdominal or pelvic lymph nodes. Reproductive: Prostate is unremarkable. Other: No free fluid, fluid collection, or free air. Musculoskeletal: No acute or abnormal lytic or blastic osseous lesions. IMPRESSION: 1. Mild mural thickening of the gastric antrum with subjective mucosal hyperenhancement, which can be seen in the setting of gastritis. 2. Apparent mild mural thickening of the ascending colon without substantial pericolonic stranding, which may be due to underdistention rather than mild colitis. 3. Bilateral lower lobe bronchiectasis with multifocal ground-glass opacities and right middle lobe consolidation, which may be due to multifocal aspiration or pneumonia. 4. Moderate luminal narrowing of the left common iliac artery due to noncalcified plaque. 5.  Aortic Atherosclerosis (ICD10-I70.0). Electronically Signed   By: Agustin Cree M.D.   On: 07/31/2022 16:39    Pending Labs Unresulted Labs (From admission, onward)     Start     Ordered   08/01/22 0500  HIV Antibody (routine testing w rflx)  (HIV Antibody (Routine testing w reflex) panel)  Tomorrow morning,   R        07/31/22 1954   08/01/22 0500  Basic metabolic panel  Tomorrow morning,   R        07/31/22 1954   08/01/22 0500  CBC with Differential/Platelet  Tomorrow morning,   R        07/31/22 1954   08/01/22 0500  Procalcitonin  Tomorrow morning,   R       References:    Procalcitonin  Lower Respiratory Tract Infection AND Sepsis Procalcitonin Algorithm   07/31/22 2006    07/31/22 1844  Blood culture (routine x 2)  BLOOD CULTURE X 2,   R      07/31/22 1843            Vitals/Pain Today's Vitals   07/31/22 1659 07/31/22 1700 07/31/22 1900 07/31/22 2119  BP:  (!) 120/97 126/87 126/75  Pulse:  92 84 81  Resp:  (!) 24 15 18   Temp: 98.7 F (37.1 C)   98.5 F (36.9 C)  TempSrc: Oral   Oral  SpO2:  98% 98% 100%  Weight:      Height:      PainSc:        Isolation Precautions Airborne and Contact precautions  Medications Medications  enoxaparin (LOVENOX) injection 40 mg (40 mg Subcutaneous Given 07/31/22 2009)  cefTRIAXone (ROCEPHIN) 2 g in sodium chloride 0.9 % 100 mL IVPB (has no administration in time range)  azithromycin (ZITHROMAX) 500 mg in sodium chloride 0.9 % 250 mL IVPB (has no administration in time range)  acetaminophen (TYLENOL) tablet 650 mg (has no administration in time range)    Or  acetaminophen (TYLENOL) suppository 650 mg (has no administration in time range)  ondansetron (ZOFRAN) tablet 4 mg (has no administration in time range)    Or  ondansetron (ZOFRAN) injection 4 mg (has no administration in time range)  senna-docusate (Senokot-S) tablet 1 tablet (has no administration in time range)  lactated ringers infusion ( Intravenous New Bag/Given 07/31/22 2009)  guaiFENesin (MUCINEX) 12 hr tablet 600 mg (600 mg Oral Given 07/31/22 2009)  albuterol (PROVENTIL) (2.5 MG/3ML) 0.083% nebulizer solution 2.5 mg (has no administration in time range)  ondansetron (ZOFRAN-ODT) disintegrating tablet 4 mg (4 mg Oral Given 07/31/22 1051)  sodium chloride 0.9 % bolus 1,000 mL (0 mLs Intravenous Stopped 07/31/22 1605)  ondansetron (ZOFRAN) injection 4 mg (4 mg Intravenous Given 07/31/22 1433)  iohexol (OMNIPAQUE) 350 MG/ML injection 75 mL (75 mLs Intravenous Contrast Given 07/31/22 1556)  cefTRIAXone (ROCEPHIN) 1 g in sodium chloride 0.9 % 100 mL IVPB (0 g Intravenous Stopped 07/31/22 1952)  azithromycin (ZITHROMAX) 500 mg in sodium chloride 0.9 % 250 mL IVPB  (0 mg Intravenous Stopped 07/31/22 2243)  lactated ringers bolus 1,000 mL (0 mLs Intravenous Stopped 07/31/22 2243)    Mobility walks     Focused Assessments Cardiac Assessment Handoff:  Cardiac Rhythm: Normal sinus rhythm No results found for: "CKTOTAL", "CKMB", "CKMBINDEX", "TROPONINI" No results found for: "DDIMER" Does the Patient currently have chest pain? No   , Pulmonary Assessment Handoff:  Lung sounds:   O2 Device: Room Air      R Recommendations: See Admitting Provider Note  Report given to:   Additional Notes:

## 2022-07-31 NOTE — Hospital Course (Signed)
Nicholas Travis is a 62 y.o. male with medical history significant for alcohol and tobacco use who is admitted with community-acquired right middle lobe pneumonia.

## 2022-07-31 NOTE — ED Provider Notes (Signed)
Nicholas Travis   CSN: [id number] Arrival date & time: 07/31/22  1009     History  Chief Complaint  Patient presents with   Weakness   Nausea   Emesis    Nicholas Travis is a 62 y.o. male.   Weakness Associated symptoms: vomiting   Emesis  This patient is a 62 year old male who denies being on any chronic medical therapy for any chronic medical conditions, he reports that over the last week he has had some nausea, urinary frequency and states that every time he drinks it goes straight through him.  He has not felt like he had a fever or chills and denies any pain except for some intermittent headaches which have been present ever since he had exposure to a gas leak 3 weeks ago.  They have since fix the gas line.  He denies any diarrhea he has not had any bloody bowel movements he has not had any tick bites he has no dysuria or hematuria and denies rashes or swelling, occasionally coughs but this is chronic related to his tobacco use which is a half a pack per day.  He has no changes in vision, occasional sore throat runny nose, nobody around him has been sick.    Home Medications Prior to Admission medications   Medication Sig Start Date End Date Taking? Authorizing Provider  cyclobenzaprine (FLEXERIL) 10 MG tablet Take 1 tablet (10 mg total) by mouth 2 (two) times daily as needed for muscle spasms. 06/14/16   Rolland Porter, MD  ibuprofen (ADVIL,MOTRIN) 800 MG tablet Take 1 tablet (800 mg total) by mouth 3 (three) times daily. 06/08/16   Rolland Porter, MD  methocarbamol (ROBAXIN) 500 MG tablet Take 1 tablet (500 mg total) by mouth 3 (three) times daily between meals as needed. 06/08/16   Rolland Porter, MD  oxyCODONE-acetaminophen (PERCOCET/ROXICET) 5-325 MG per tablet Take 1 tablet by mouth every 6 (six) hours as needed for pain. Patient not taking: Reported on 03/19/2014 11/25/12   Roxy Horseman, PA-C      Allergies    Bee venom     Review of Systems   Review of Systems  Gastrointestinal:  Positive for vomiting.  Neurological:  Positive for weakness.  All other systems reviewed and are negative.   Physical Exam Updated Vital Signs BP 124/81 (BP Location: Right Arm)   Pulse 92   Temp 98.9 F (37.2 C)   Resp 18   Ht 1.702 m (5\' 7" )   Wt 61.2 kg   SpO2 98%   BMI 21.14 kg/m  Physical Exam Vitals and nursing Travis reviewed.  Constitutional:      General: He is not in acute distress.    Appearance: He is well-developed.  HENT:     Head: Normocephalic and atraumatic.     Mouth/Throat:     Mouth: Mucous membranes are moist.     Pharynx: No oropharyngeal exudate.     Comments: Minimal redness in the posterior throat, no exudate or tonsillar hypertrophy or asymmetry Eyes:     General: No scleral icterus.       Right eye: No discharge.        Left eye: No discharge.     Conjunctiva/sclera: Conjunctivae normal.     Pupils: Pupils are equal, round, and reactive to light.  Neck:     Thyroid: No thyromegaly.     Vascular: No JVD.  Cardiovascular:     Rate and Rhythm:  Normal rate and regular rhythm.     Heart sounds: Normal heart sounds. No murmur heard.    No friction rub. No gallop.  Pulmonary:     Effort: Pulmonary effort is normal. No respiratory distress.     Breath sounds: Normal breath sounds. No wheezing or rales.  Abdominal:     General: Bowel sounds are normal. There is no distension.     Palpations: Abdomen is soft. There is no mass.     Tenderness: There is no abdominal tenderness.     Comments: No CVA tenderness  Musculoskeletal:        General: No tenderness. Normal range of motion.     Cervical back: Normal range of motion and neck supple.     Right lower leg: No edema.     Left lower leg: No edema.  Lymphadenopathy:     Cervical: No cervical adenopathy.  Skin:    General: Skin is warm and dry.     Findings: No erythema or rash.  Neurological:     Mental Status: He is alert.      Coordination: Coordination normal.  Psychiatric:        Behavior: Behavior normal.     ED Results / Procedures / Treatments   Labs (all labs ordered are listed, but only abnormal results are displayed) Labs Reviewed  COMPREHENSIVE METABOLIC PANEL - Abnormal; Notable for the following components:      Result Value   Sodium 134 (*)    Glucose, Bld 137 (*)    Calcium 8.6 (*)    Albumin 3.4 (*)    AST 11 (*)    All other components within normal limits  CBC - Abnormal; Notable for the following components:   WBC 18.7 (*)    All other components within normal limits  LIPASE, BLOOD  URINALYSIS, ROUTINE W REFLEX MICROSCOPIC  LACTIC ACID, PLASMA  LACTIC ACID, PLASMA    EKG None  Radiology No results found.  Procedures Procedures    Medications Ordered in ED Medications  sodium chloride 0.9 % bolus 1,000 mL (has no administration in time range)  ondansetron (ZOFRAN) injection 4 mg (has no administration in time range)  ondansetron (ZOFRAN-ODT) disintegrating tablet 4 mg (4 mg Oral Given 07/31/22 1051)    ED Course/ Medical Decision Making/ A&P Clinical Course as of 07/31/22 1640  Thu Jul 31, 2022  1614 Stable  62 YOM with  a chief complaint of fever, abdominal pain. CT done. WBC 18 Follow up CTAP [CC]    Clinical Course User Index [CC] Glyn Ade, MD                             Medical Decision Making Amount and/or Complexity of Data Reviewed Labs: ordered. Radiology: ordered.  Risk Prescription drug management.    This patient presents to the ED for concern of nausea urinary frequency and some dehydration.  Of Travis the patient does have a leukocytosis of 18,000 which is unaccounted for, this involves an extensive number of treatment options, and is a complaint that carries with it a high risk of complications and morbidity.  The differential diagnosis includes underlying infection, new onset diabetes, urinary infection or pyelonephritis, the patient was  found to be febrile at 100.6   Co morbidities that complicate the patient evaluation  Tobacco use   Additional history obtained:  Additional history obtained from medical record External records from outside source obtained and reviewed  including multiple emergency department visits over time, no recent evaluations, no recent family doctors visits   Lab Tests:  I Ordered, and personally interpreted labs.  The pertinent results include: Doses of 18,000   Imaging Studies ordered:  I ordered imaging studies including CT scan of the abdomen and pelvis I independently visualized and interpreted imaging which showed pending at the time of change of shift I agree with the radiologist interpretation   Cardiac Monitoring: / EKG:  The patient was maintained on a cardiac monitor.  I personally viewed and interpreted the cardiac monitored which showed an underlying rhythm of: Normal sinus rhythm, no tachycardia   Consultations Obtained:  Discussed with oncoming physician Dr. Glyn Ade who will follow-up results and disposition accordingly        Final Clinical Impression(s) / ED Diagnoses Final diagnoses:  None    Rx / DC Orders ED Discharge Orders     None         Eber Hong, MD 07/31/22 1641

## 2022-07-31 NOTE — ED Triage Notes (Addendum)
Wife stated, he's been having N/V for 4 to 5 days ago.We had a gas leak last week so I'm not sure if that is part of it.

## 2022-07-31 NOTE — ED Notes (Signed)
Got patient on the monitor into a gown patient is resting with call bell in reach  

## 2022-08-01 ENCOUNTER — Other Ambulatory Visit (HOSPITAL_COMMUNITY): Payer: Self-pay

## 2022-08-01 ENCOUNTER — Inpatient Hospital Stay (HOSPITAL_COMMUNITY): Payer: Self-pay

## 2022-08-01 DIAGNOSIS — F1990 Other psychoactive substance use, unspecified, uncomplicated: Secondary | ICD-10-CM

## 2022-08-01 LAB — CBC WITH DIFFERENTIAL/PLATELET
Abs Immature Granulocytes: 0.06 10*3/uL (ref 0.00–0.07)
Basophils Absolute: 0 10*3/uL (ref 0.0–0.1)
Basophils Relative: 0 %
Eosinophils Absolute: 0.1 10*3/uL (ref 0.0–0.5)
Eosinophils Relative: 1 %
HCT: 37.9 % — ABNORMAL LOW (ref 39.0–52.0)
Hemoglobin: 12.6 g/dL — ABNORMAL LOW (ref 13.0–17.0)
Immature Granulocytes: 1 %
Lymphocytes Relative: 21 %
Lymphs Abs: 2.5 10*3/uL (ref 0.7–4.0)
MCH: 30.3 pg (ref 26.0–34.0)
MCHC: 33.2 g/dL (ref 30.0–36.0)
MCV: 91.1 fL (ref 80.0–100.0)
Monocytes Absolute: 1.1 10*3/uL — ABNORMAL HIGH (ref 0.1–1.0)
Monocytes Relative: 9 %
Neutro Abs: 7.9 10*3/uL — ABNORMAL HIGH (ref 1.7–7.7)
Neutrophils Relative %: 68 %
Platelets: 217 10*3/uL (ref 150–400)
RBC: 4.16 MIL/uL — ABNORMAL LOW (ref 4.22–5.81)
RDW: 12.8 % (ref 11.5–15.5)
WBC: 11.7 10*3/uL — ABNORMAL HIGH (ref 4.0–10.5)
nRBC: 0 % (ref 0.0–0.2)

## 2022-08-01 LAB — BASIC METABOLIC PANEL
Anion gap: 3 — ABNORMAL LOW (ref 5–15)
BUN: 13 mg/dL (ref 6–20)
CO2: 26 mmol/L (ref 22–32)
Calcium: 8.2 mg/dL — ABNORMAL LOW (ref 8.9–10.3)
Chloride: 109 mmol/L (ref 98–111)
Creatinine, Ser: 0.92 mg/dL (ref 0.61–1.24)
GFR, Estimated: >60 mL/min (ref >60)
Glucose, Bld: 133 mg/dL — ABNORMAL HIGH (ref 70–99)
Potassium: 3.6 mmol/L (ref 3.5–5.1)
Sodium: 138 mmol/L (ref 135–145)

## 2022-08-01 LAB — HIV ANTIBODY (ROUTINE TESTING W REFLEX): HIV Screen 4th Generation wRfx: NONREACTIVE

## 2022-08-01 LAB — PROCALCITONIN: Procalcitonin: <0.1 ng/mL

## 2022-08-01 MED ORDER — LEVOFLOXACIN 750 MG PO TABS
750.0000 mg | ORAL_TABLET | Freq: Every day | ORAL | 0 refills | Status: AC
  Filled 2022-08-01: qty 3, 3d supply, fill #0

## 2022-08-01 MED ORDER — BENZONATATE 100 MG PO CAPS
100.0000 mg | ORAL_CAPSULE | Freq: Three times a day (TID) | ORAL | 0 refills | Status: DC | PRN
  Filled 2022-08-01: qty 15, 5d supply, fill #0

## 2022-08-01 NOTE — TOC CM/SW Note (Signed)
Transition of Care [facility]) - Inpatient Brief Assessment   Patient Details  Name: Nicholas Travis MRN: [id number] Date of Birth: Dec 12, 1960  [facility]) CM/SW Contact:    Tom-Johnson, Hershal Coria, RN Phone Number: 08/01/2022, 11:07 AM   Clinical Narrative:    Transition of Care Asessment: Insurance and Status: Insurance coverage has been reviewed (MATCH done.) Patient has primary care physician: No (New patient establishment and hopsp f/u with Internal Medicine.) Home environment has been reviewed: Yes Prior level of function:: Independent Prior/Current Home Services: No current home services Social Determinants of Health Reivew: SDOH reviewed no interventions necessary Readmission risk has been reviewed: Yes Transition of care needs: transition of care needs identified, TOC will continue to follow (MATCH done, PCP established at [facility].)

## 2022-08-01 NOTE — Progress Notes (Signed)
Seen and examined earlier this morning Feels much better Hardly any cough On room air Vital signs stable Cultures negative Transition to oral antibiotic Discharge home Please see discharge summary for details.

## 2022-08-01 NOTE — TOC Transition Note (Signed)
Transition of Care [facility]) - CM/SW Discharge Note   Patient Details  Name: Nicholas Travis MRN: [id number] Date of Birth: September 16, 1960  Transition of Care [facility]) CM/SW Contact:  Tom-Johnson, Hershal Coria, RN Phone Number: 08/01/2022, 11:07 AM   Clinical Narrative:     Patient is scheduled for discharge today.  Readmission Risk Assessment done. New patient establishment/hospital f/u and discharge instructions on AVS. Prescriptions sent to [facility] pharmacy and meds will be delivered to patient at bedside prior discharge. MATCH done for prescription assistance Significant other, Lavaughn  to transport at discharge.  No further TOC needs noted.        Final next level of care: Home/Self Care Barriers to Discharge: Barriers Resolved   Patient Goals and CMS Choice CMS Medicare.gov Compare Post Acute Care list provided to:: Patient Choice offered to / list presented to : Patient  Discharge Placement                  Patient to be transferred to facility by: Significant other Name of family member notified: Lavaughn    Discharge Plan and Services Additional resources added to the After Visit Summary for   In-house Referral: PCP / [facility] Discharge Planning Services: CM Consult, MATCH Program, Follow-up appt scheduled            DME Arranged: N/A DME Agency: NA       HH Arranged: NA HH Agency: NA        Social Determinants of Health (SDOH) Interventions SDOH Screenings   Food Insecurity: No Food Insecurity (08/01/2022)  Housing: Patient Declined (08/01/2022)  Transportation Needs: No Transportation Needs (08/01/2022)  Utilities: Not At Risk (08/01/2022)  Tobacco Use: High Risk (02/13/2020)     Readmission Risk Interventions    08/01/2022   11:03 AM  Readmission Risk Prevention Plan  Post Dischage Appt Complete  Medication Screening Complete  Transportation Screening Complete

## 2022-08-01 NOTE — Discharge Summary (Signed)
PATIENT DETAILS Name: Nicholas Travis Age: 62 y.o. Sex: male Date of Birth: 27-Feb-1960 MRN: [id number]. Admitting Physician: Charlsie Quest, MD WUJ:[id number], No Pcp Per  Admit Date: 07/31/2022 Discharge date: 08/01/2022  Recommendations for Outpatient Follow-up:  Follow up with PCP in 1-2 weeks Please obtain CMP/CBC in one week Please repeat two-view chest x-ray in 4 to 6 weeks.  Admitted From:  Home  Disposition: Home   Discharge Condition: good  CODE STATUS:   Code Status: Full Code   Diet recommendation:  Diet Order             Diet general           Diet regular Room service appropriate? Yes; Fluid consistency: Thin  Diet effective now                    Brief Summary: Nicholas Travis is a 62 y.o. male with medical history significant for alcohol and tobacco use who is admitted with community-acquired right middle lobe pneumonia.  Brief Hospital Course: Community-acquired pneumonia Feels significantly better this morning-seems to have rapidly improved after he received IV antibiotics.  Cultures negative so far He is requesting discharge-he remains hemodynamically stable-afebrile overnight-on room air.  Suspect we could transition to oral antibiotics and discharge him home.  Polysubstance abuse (tobacco/cocaine/marijuana/alcohol on occasion) Counseled extensively-does not want a nicotine patch.  BMI: Estimated body mass index is 20.28 kg/m as calculated from the following:   Height as of this encounter: 5\' 7"  (1.702 m).   Weight as of this encounter: 58.7 kg.    Discharge Diagnoses:  Principal Problem:   Community acquired pneumonia of right middle lobe of lung Active Problems:   Polysubstance use disorder   Discharge Instructions:  Activity:  As tolerated with Full fall precautions use walker/cane & assistance as needed  Discharge Instructions     Call MD for:  difficulty breathing, headache or visual disturbances   Complete by: As  directed    Diet general   Complete by: As directed    Discharge instructions   Complete by: As directed    Follow with Primary MD  in 1-2 weeks  Please ask your primary care practitioner to repeat two-view chest x-ray in 4 to 6 weeks.  Please get a complete blood count and chemistry panel checked by your Primary MD at your next visit, and again as instructed by your Primary MD.  Get Medicines reviewed and adjusted: Please take all your medications with you for your next visit with your Primary MD  Laboratory/radiological data: Please request your Primary MD to go over all hospital tests and procedure/radiological results at the follow up, please ask your Primary MD to get all Hospital records sent to his/her office.  In some cases, they will be blood work, cultures and biopsy results pending at the time of your discharge. Please request that your primary care M.D. follows up on these results.  Also Note the following: If you experience worsening of your admission symptoms, develop shortness of breath, life threatening emergency, suicidal or homicidal thoughts you must seek medical attention immediately by calling 911 or calling your MD immediately  if symptoms less severe.  You must read complete instructions/literature along with all the possible adverse reactions/side effects for all the Medicines you take and that have been prescribed to you. Take any new Medicines after you have completely understood and accpet all the possible adverse reactions/side effects.   Do not drive when taking Pain  medications or sleeping medications (Benzodaizepines)  Do not take more than prescribed Pain, Sleep and Anxiety Medications. It is not advisable to combine anxiety,sleep and pain medications without talking with your primary care practitioner  Special Instructions: If you have smoked or chewed Tobacco  in the last 2 yrs please stop smoking, stop any regular Alcohol  and or any Recreational drug  use.  Wear Seat belts while driving.  Please note: You were cared for by a hospitalist during your hospital stay. Once you are discharged, your primary care physician will handle any further medical issues. Please note that NO REFILLS for any discharge medications will be authorized once you are discharged, as it is imperative that you return to your primary care physician (or establish a relationship with a primary care physician if you do not have one) for your post hospital discharge needs so that they can reassess your need for medications and monitor your lab values.   Increase activity slowly   Complete by: As directed       Allergies as of 08/01/2022       Reactions   Bee Venom Swelling        Medication List     TAKE these medications    acetaminophen 500 MG tablet Commonly known as: TYLENOL Take 1,000 mg by mouth daily as needed for fever, headache or moderate pain.   benzonatate 100 MG capsule Commonly known as: Tessalon Perles Take 1 capsule (100 mg total) by mouth 3 (three) times daily as needed for cough.   levofloxacin 750 MG tablet Commonly known as: Levaquin Take 1 tablet (750 mg total) by mouth daily for 3 days. Start taking on: August 02, 2022        Allergies  Allergen Reactions   Bee Venom Swelling     Other Procedures/Studies: DG Chest 2 View  Result Date: 08/01/2022 CLINICAL DATA:  Community-acquired right middle lobe pneumonia. EXAM: CHEST - 2 VIEW COMPARISON:  06/08/2016 FINDINGS: Heart size and pulmonary vascularity are normal. Emphysematous changes in the lungs. Right middle lobe infiltration consistent with pneumonia. Left lung is clear. No pleural effusions. No pneumothorax. Mediastinal contours appear intact. IMPRESSION: Consolidation in the right middle lobe consistent with right middle lobe pneumonia. Electronically Signed   By: Burman Nieves M.D.   On: 08/01/2022 00:27   CT ABDOMEN PELVIS W CONTRAST  Result Date: 07/31/2022 CLINICAL  DATA:  Right lower quadrant abdominal pain associated with 4-5 day history of nausea and vomiting EXAM: CT ABDOMEN AND PELVIS WITH CONTRAST TECHNIQUE: Multidetector CT imaging of the abdomen and pelvis was performed using the standard protocol following bolus administration of intravenous contrast. RADIATION DOSE REDUCTION: This exam was performed according to the departmental dose-optimization program which includes automated exposure control, adjustment of the mA and/or kV according to patient size and/or use of iterative reconstruction technique. CONTRAST:  75mL OMNIPAQUE IOHEXOL 350 MG/ML SOLN COMPARISON:  None Available. FINDINGS: Lower chest: Bilateral lower lobe bronchiectasis with multifocal ground-glass opacities and right middle lobe consolidation. No pleural effusion or pneumothorax demonstrated. Partially imaged heart size is normal. Hepatobiliary: No focal hepatic lesions. No intra or extrahepatic biliary ductal dilation. Gallbladder is contracted. Pancreas: No focal lesions or main ductal dilation. Spleen: Normal in size without focal abnormality. Adrenals/Urinary Tract: No adrenal nodules. No suspicious renal mass, calculi or hydronephrosis. Subcentimeter left renal hypodensities, too small to characterize, but likely cysts. No specific follow-up imaging recommended. No focal bladder wall thickening. Stomach/Bowel: Mild mural thickening of the gastric antrum with  subjective mucosal hyperenhancement. Apparent mild mural thickening of the ascending colon without substantial pericolonic stranding. No abnormal bowel distention. Diverticulum along the ascending colon. Normal appendix. Vascular/Lymphatic: Aortic atherosclerosis. Moderate luminal narrowing of the left common iliac artery due to noncalcified plaque. No enlarged abdominal or pelvic lymph nodes. Reproductive: Prostate is unremarkable. Other: No free fluid, fluid collection, or free air. Musculoskeletal: No acute or abnormal lytic or blastic  osseous lesions. IMPRESSION: 1. Mild mural thickening of the gastric antrum with subjective mucosal hyperenhancement, which can be seen in the setting of gastritis. 2. Apparent mild mural thickening of the ascending colon without substantial pericolonic stranding, which may be due to underdistention rather than mild colitis. 3. Bilateral lower lobe bronchiectasis with multifocal ground-glass opacities and right middle lobe consolidation, which may be due to multifocal aspiration or pneumonia. 4. Moderate luminal narrowing of the left common iliac artery due to noncalcified plaque. 5.  Aortic Atherosclerosis (ICD10-I70.0). Electronically Signed   By: Agustin Cree M.D.   On: 07/31/2022 16:39     TODAY-DAY OF DISCHARGE:  Subjective:   [place] today has no headache,no chest abdominal pain,no new weakness tingling or numbness, feels much better wants to go home today.   Objective:   Blood pressure 126/78, pulse 61, temperature 97.9 F (36.6 C), temperature source Oral, resp. rate 18, height 5\' 7"  (1.702 m), weight 58.7 kg, SpO2 98 %.  Intake/Output Summary (Last 24 hours) at 08/01/2022 0911 Last data filed at 08/01/2022 0300 Gross per 24 hour  Intake 1881.54 ml  Output --  Net 1881.54 ml   Filed Weights   07/31/22 1046 08/01/22 0100  Weight: 61.2 kg 58.7 kg    Exam: Awake Alert, Oriented *3, No new F.N deficits, Normal affect [place].AT,PERRAL Supple Neck,No JVD, No cervical lymphadenopathy appriciated.  Symmetrical Chest wall movement, Good air movement bilaterally, CTAB RRR,No Gallops,Rubs or new Murmurs, No Parasternal Heave +ve B.Sounds, Abd Soft, Non tender, No organomegaly appriciated, No rebound -guarding or rigidity. No Cyanosis, Clubbing or edema, No new Rash or bruise   PERTINENT RADIOLOGIC STUDIES: DG Chest 2 View  Result Date: 08/01/2022 CLINICAL DATA:  Community-acquired right middle lobe pneumonia. EXAM: CHEST - 2 VIEW COMPARISON:  06/08/2016 FINDINGS: Heart size and  pulmonary vascularity are normal. Emphysematous changes in the lungs. Right middle lobe infiltration consistent with pneumonia. Left lung is clear. No pleural effusions. No pneumothorax. Mediastinal contours appear intact. IMPRESSION: Consolidation in the right middle lobe consistent with right middle lobe pneumonia. Electronically Signed   By: Burman Nieves M.D.   On: 08/01/2022 00:27   CT ABDOMEN PELVIS W CONTRAST  Result Date: 07/31/2022 CLINICAL DATA:  Right lower quadrant abdominal pain associated with 4-5 day history of nausea and vomiting EXAM: CT ABDOMEN AND PELVIS WITH CONTRAST TECHNIQUE: Multidetector CT imaging of the abdomen and pelvis was performed using the standard protocol following bolus administration of intravenous contrast. RADIATION DOSE REDUCTION: This exam was performed according to the departmental dose-optimization program which includes automated exposure control, adjustment of the mA and/or kV according to patient size and/or use of iterative reconstruction technique. CONTRAST:  75mL OMNIPAQUE IOHEXOL 350 MG/ML SOLN COMPARISON:  None Available. FINDINGS: Lower chest: Bilateral lower lobe bronchiectasis with multifocal ground-glass opacities and right middle lobe consolidation. No pleural effusion or pneumothorax demonstrated. Partially imaged heart size is normal. Hepatobiliary: No focal hepatic lesions. No intra or extrahepatic biliary ductal dilation. Gallbladder is contracted. Pancreas: No focal lesions or main ductal dilation. Spleen: Normal in size without focal abnormality. Adrenals/Urinary  Tract: No adrenal nodules. No suspicious renal mass, calculi or hydronephrosis. Subcentimeter left renal hypodensities, too small to characterize, but likely cysts. No specific follow-up imaging recommended. No focal bladder wall thickening. Stomach/Bowel: Mild mural thickening of the gastric antrum with subjective mucosal hyperenhancement. Apparent mild mural thickening of the ascending  colon without substantial pericolonic stranding. No abnormal bowel distention. Diverticulum along the ascending colon. Normal appendix. Vascular/Lymphatic: Aortic atherosclerosis. Moderate luminal narrowing of the left common iliac artery due to noncalcified plaque. No enlarged abdominal or pelvic lymph nodes. Reproductive: Prostate is unremarkable. Other: No free fluid, fluid collection, or free air. Musculoskeletal: No acute or abnormal lytic or blastic osseous lesions. IMPRESSION: 1. Mild mural thickening of the gastric antrum with subjective mucosal hyperenhancement, which can be seen in the setting of gastritis. 2. Apparent mild mural thickening of the ascending colon without substantial pericolonic stranding, which may be due to underdistention rather than mild colitis. 3. Bilateral lower lobe bronchiectasis with multifocal ground-glass opacities and right middle lobe consolidation, which may be due to multifocal aspiration or pneumonia. 4. Moderate luminal narrowing of the left common iliac artery due to noncalcified plaque. 5.  Aortic Atherosclerosis (ICD10-I70.0). Electronically Signed   By: Agustin Cree M.D.   On: 07/31/2022 16:39     PERTINENT LAB RESULTS: CBC: Recent Labs    07/31/22 1102 08/01/22 0633  WBC 18.7* 11.7*  HGB 14.8 12.6*  HCT 43.6 37.9*  PLT 239 217   CMET CMP     Component Value Date/Time   NA 138 08/01/2022 0633   K 3.6 08/01/2022 0633   CL 109 08/01/2022 0633   CO2 26 08/01/2022 0633   GLUCOSE 133 (H) 08/01/2022 0633   BUN 13 08/01/2022 0633   CREATININE 0.92 08/01/2022 0633   CALCIUM 8.2 (L) 08/01/2022 0633   PROT 6.9 07/31/2022 1102   ALBUMIN 3.4 (L) 07/31/2022 1102   AST 11 (L) 07/31/2022 1102   ALT 10 07/31/2022 1102   ALKPHOS 76 07/31/2022 1102   BILITOT 1.1 07/31/2022 1102   GFRNONAA >60 08/01/2022 0633    GFR Estimated Creatinine Clearance: 70.9 mL/min (by C-G formula based on SCr of 0.92 mg/dL). Recent Labs    07/31/22 1102  LIPASE 38   No  results for input(s): "CKTOTAL", "CKMB", "CKMBINDEX", "TROPONINI" in the last 72 hours. Invalid input(s): "POCBNP" No results for input(s): "DDIMER" in the last 72 hours. No results for input(s): "HGBA1C" in the last 72 hours. No results for input(s): "CHOL", "HDL", "LDLCALC", "TRIG", "CHOLHDL", "LDLDIRECT" in the last 72 hours. No results for input(s): "TSH", "T4TOTAL", "T3FREE", "THYROIDAB" in the last 72 hours.  Invalid input(s): "FREET3" No results for input(s): "VITAMINB12", "FOLATE", "FERRITIN", "TIBC", "IRON", "RETICCTPCT" in the last 72 hours. Coags: No results for input(s): "INR" in the last 72 hours.  Invalid input(s): "PT" Microbiology: Recent Results (from the past 240 hour(s))  Blood culture (routine x 2)     Status: None (Preliminary result)   Collection Time: 07/31/22  7:00 PM   Specimen: BLOOD LEFT FOREARM  Result Value Ref Range Status   Specimen Description BLOOD LEFT FOREARM  Final   Special Requests   Final    BOTTLES DRAWN AEROBIC AND ANAEROBIC Blood Culture adequate volume Performed at [facility], [address] [place], [place] [address]    Culture PENDING  Incomplete   Report Status PENDING  Incomplete  SARS Coronavirus 2 by RT PCR (hospital order, performed in [facility] hospital lab) *cepheid single result test* Anterior Nasal  Swab     Status: None   Collection Time: 07/31/22  7:58 PM   Specimen: Anterior Nasal Swab  Result Value Ref Range Status   SARS Coronavirus 2 by RT PCR NEGATIVE NEGATIVE Final    Comment: Performed at [facility], [address], [place], [place] [address]    FURTHER DISCHARGE INSTRUCTIONS:  Get Medicines reviewed and adjusted: Please take all your medications with you for your next visit with your Primary MD  Laboratory/radiological data: Please request your Primary MD to go over all hospital tests and procedure/radiological results at the follow up, please ask your Primary MD to get all Hospital records sent  to his/her office.  In some cases, they will be blood work, cultures and biopsy results pending at the time of your discharge. Please request that your primary care M.D. goes through all the records of your hospital data and follows up on these results.  Also Note the following: If you experience worsening of your admission symptoms, develop shortness of breath, life threatening emergency, suicidal or homicidal thoughts you must seek medical attention immediately by calling 911 or calling your MD immediately  if symptoms less severe.  You must read complete instructions/literature along with all the possible adverse reactions/side effects for all the Medicines you take and that have been prescribed to you. Take any new Medicines after you have completely understood and accpet all the possible adverse reactions/side effects.   Do not drive when taking Pain medications or sleeping medications (Benzodaizepines)  Do not take more than prescribed Pain, Sleep and Anxiety Medications. It is not advisable to combine anxiety,sleep and pain medications without talking with your primary care practitioner  Special Instructions: If you have smoked or chewed Tobacco  in the last 2 yrs please stop smoking, stop any regular Alcohol  and or any Recreational drug use.  Wear Seat belts while driving.  Please note: You were cared for by a hospitalist during your hospital stay. Once you are discharged, your primary care physician will handle any further medical issues. Please note that NO REFILLS for any discharge medications will be authorized once you are discharged, as it is imperative that you return to your primary care physician (or establish a relationship with a primary care physician if you do not have one) for your post hospital discharge needs so that they can reassess your need for medications and monitor your lab values.  Total Time spent coordinating discharge including counseling, education and face to  face time equals greater than 30 minutes.  SignedJeoffrey Massed 08/01/2022 9:11 AM

## 2022-08-02 LAB — CULTURE, BLOOD (ROUTINE X 2): Culture: NO GROWTH

## 2022-08-03 LAB — CULTURE, BLOOD (ROUTINE X 2)
Culture: NO GROWTH
Special Requests: ADEQUATE

## 2022-08-05 LAB — CULTURE, BLOOD (ROUTINE X 2): Special Requests: ADEQUATE

## 2022-08-14 ENCOUNTER — Ambulatory Visit: Payer: Self-pay | Admitting: Student

## 2023-05-13 ENCOUNTER — Emergency Department (HOSPITAL_BASED_OUTPATIENT_CLINIC_OR_DEPARTMENT_OTHER): Payer: Self-pay | Admitting: Radiology

## 2023-05-13 ENCOUNTER — Other Ambulatory Visit: Payer: Self-pay

## 2023-05-13 DIAGNOSIS — S2242XA Multiple fractures of ribs, left side, initial encounter for closed fracture: Secondary | ICD-10-CM | POA: Insufficient documentation

## 2023-05-13 DIAGNOSIS — Y9302 Activity, running: Secondary | ICD-10-CM | POA: Insufficient documentation

## 2023-05-13 DIAGNOSIS — W19XXXA Unspecified fall, initial encounter: Secondary | ICD-10-CM | POA: Insufficient documentation
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
## 2023-05-14 ENCOUNTER — Encounter (HOSPITAL_BASED_OUTPATIENT_CLINIC_OR_DEPARTMENT_OTHER): Payer: Self-pay

## 2023-05-14 ENCOUNTER — Emergency Department (HOSPITAL_BASED_OUTPATIENT_CLINIC_OR_DEPARTMENT_OTHER)
Admission: EM | Admit: 2023-05-14 | Discharge: 2023-05-14 | Disposition: A | Discharge status: Home / Self Care | Payer: Self-pay | Attending: Emergency Medicine | Admitting: Emergency Medicine

## 2023-05-14 DIAGNOSIS — S2242XA Multiple fractures of ribs, left side, initial encounter for closed fracture: Secondary | ICD-10-CM

## 2023-05-14 MED ORDER — OXYCODONE-ACETAMINOPHEN 5-325 MG PO TABS: 1.0000 | ORAL_TABLET | Freq: Four times a day (QID) | ORAL | 0 refills | Status: DC | PRN

## 2023-05-14 MED ORDER — OXYCODONE-ACETAMINOPHEN 5-325 MG PO TABS
2.0000 | ORAL_TABLET | Freq: Once | ORAL | Status: AC
  Administered 2023-05-14: 2 via ORAL
  Filled 2023-05-14: qty 2
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
## 2023-05-20 ENCOUNTER — Emergency Department (HOSPITAL_BASED_OUTPATIENT_CLINIC_OR_DEPARTMENT_OTHER)
Admission: EM | Admit: 2023-05-20 | Discharge: 2023-05-20 | Disposition: A | Discharge status: Home / Self Care | Payer: Self-pay | Attending: Emergency Medicine | Admitting: Emergency Medicine

## 2023-05-20 ENCOUNTER — Encounter (HOSPITAL_BASED_OUTPATIENT_CLINIC_OR_DEPARTMENT_OTHER): Payer: Self-pay

## 2023-05-20 ENCOUNTER — Other Ambulatory Visit: Payer: Self-pay

## 2023-05-20 DIAGNOSIS — W1839XD Other fall on same level, subsequent encounter: Secondary | ICD-10-CM | POA: Insufficient documentation

## 2023-05-20 DIAGNOSIS — S2242XD Multiple fractures of ribs, left side, subsequent encounter for fracture with routine healing: Secondary | ICD-10-CM | POA: Insufficient documentation

## 2023-05-20 DIAGNOSIS — R0789 Other chest pain: Secondary | ICD-10-CM

## 2023-05-20 MED ORDER — BENZONATATE 100 MG PO CAPS: 100.0000 mg | ORAL_CAPSULE | Freq: Three times a day (TID) | ORAL | 0 refills | Status: AC | PRN

## 2023-05-20 MED ORDER — OXYCODONE HCL 5 MG PO TABS: 2.5000 mg | ORAL_TABLET | Freq: Four times a day (QID) | ORAL | 0 refills | Status: AC | PRN

## 2023-05-20 MED ORDER — MELOXICAM 15 MG PO TABS: 15.0000 mg | ORAL_TABLET | Freq: Every day | ORAL | 0 refills | Status: AC | PRN

## 2023-05-20 MED ORDER — LIDOCAINE 5 % EX PTCH: 1.0000 | MEDICATED_PATCH | CUTANEOUS | 0 refills | Status: AC
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
## 2023-05-20 NOTE — ED Provider Notes (Signed)
 [facility] AT [facility] Provider Note   CSN: [id number] Arrival date & time: 05/20/23  1542     History  Chief Complaint  Patient presents with   Rib Injury    KEES IDROVO is a 63 y.o. male.  HPI   63 year old male presents emergency department with complaints of continued left-sided rib pain.  Larey Seat down his left side on 05/13/2023 and was seen in the ED at that time with concerns for 2 rib fractures on the left side.  Patient reports symptoms controlled with at home pain medication but recently ran out.  Has been using his symptom spirometry.  Has been trying Tylenol which has not been helping on its own.  Denies any repeat injury, shortness of breath, abdominal pain, nausea, vomiting, fever.  States that he is trying to set up care with a primary care but does not have an appointment for some time prompting visit to the emergency department.  Past medical history significant for polysubstance use  Home Medications Prior to Admission medications   Medication Sig Start Date End Date Taking? Authorizing Provider  benzonatate (TESSALON) 100 MG capsule Take 1 capsule (100 mg total) by mouth 3 (three) times daily as needed. 05/20/23  Yes Sherian Maroon A, PA  lidocaine (LIDODERM) 5 % Place 1 patch onto the skin daily. Remove & Discard patch within 12 hours or as directed by MD 05/20/23  Yes Peter Garter, PA  meloxicam (MOBIC) 15 MG tablet Take 1 tablet (15 mg total) by mouth daily as needed. 05/20/23  Yes Sherian Maroon A, PA  oxyCODONE (ROXICODONE) 5 MG immediate release tablet Take 0.5 tablets (2.5 mg total) by mouth every 6 (six) hours as needed for severe pain (pain score 7-10). 05/20/23  Yes Peter Garter, PA      Allergies    Bee venom    Review of Systems   Review of Systems  All other systems reviewed and are negative.   Physical Exam Updated Vital Signs BP 118/88 (BP Location: Right Arm)   Pulse 66   Temp 98.3 F (36.8 C)    Resp 18   Ht 5' 7.5" (1.715 m)   Wt 61.2 kg   SpO2 96%   BMI 20.83 kg/m  Physical Exam Vitals and nursing note reviewed.  Constitutional:      General: He is not in acute distress.    Appearance: He is well-developed.  HENT:     Head: Normocephalic and atraumatic.  Eyes:     Conjunctiva/sclera: Conjunctivae normal.  Cardiovascular:     Rate and Rhythm: Normal rate and regular rhythm.     Heart sounds: No murmur heard. Pulmonary:     Effort: Pulmonary effort is normal. No respiratory distress.     Breath sounds: Normal breath sounds. No wheezing, rhonchi or rales.     Comments: Left chest wall tender.  No obvious flail chest. Abdominal:     Palpations: Abdomen is soft.     Tenderness: There is no abdominal tenderness.  Musculoskeletal:        General: No swelling.     Cervical back: Neck supple.  Skin:    General: Skin is warm and dry.     Capillary Refill: Capillary refill takes less than 2 seconds.  Neurological:     Mental Status: He is alert.  Psychiatric:        Mood and Affect: Mood normal.     ED Results / Procedures / Treatments  Labs (all labs ordered are listed, but only abnormal results are displayed) Labs Reviewed - No data to display  EKG None  Radiology No results found.  Procedures Procedures    Medications Ordered in ED Medications - No data to display  ED Course/ Medical Decision Making/ A&P                                 Medical Decision Making Risk Prescription drug management.   This patient presents to the ED for concern of chest wall pain, this involves an extensive number of treatment options, and is a complaint that carries with it a high risk of complications and morbidity.  The differential diagnosis includes fracture, flail chest, pneumothorax, ACS, PE, other   Co morbidities that complicate the patient evaluation  See HPI   Additional history obtained:  Additional history obtained from EMR External records from  outside source obtained and reviewed including hospital records   Lab Tests:  N/a   Imaging Studies ordered:  N/a   Cardiac Monitoring: / EKG:  The patient was maintained on a cardiac monitor.  I personally viewed and interpreted the cardiac monitored which showed an underlying rhythm of: Sinus rhythm   Consultations Obtained:  N/a   Problem List / ED Course / Critical interventions / Medication management  Left chest wall pain, Reevaluation of the patient showed that the patient stayed the same I have reviewed the patients home medicines and have made adjustments as needed   Social Determinants of Health:  Polysubstance use   Test / Admission - Considered:  Left chest wall pain, Vitals signs within normal range and stable throughout visit. 63 year old male presents emergency department with complaints of continued left-sided rib pain.  Larey Seat down his left side on 05/13/2023 and was seen in the ED at that time with concerns for 2 rib fractures on the left side.  Patient reports symptoms controlled with at home pain medication but recently ran out.  Has been using his symptom spirometry.  Has been trying Tylenol which has not been helping on its own.  Denies any repeat injury, shortness of breath, abdominal pain, nausea, vomiting, fever.  States that he is trying to set up care with a primary care but does not have an appointment for some time prompting visit to the emergency department. On exam, tender palpation left chest wall.  No obvious flail chest.  Lungs clear to auscultation bilaterally with symmetric breath sounds throughout lung fields..  Patient returning back to the emergency department with a syncopal related to pain control given that he is out of his at home pain medication more so than any acute change in symptoms.  Offered repeat chest x-ray imaging but this was deferred.  Will send in medication to help with patient's discomfort, recommend continued incentive  spirometry and follow-up with PCP.  Treatment plan discussed at length with patient and he acknowledged understanding was agreeable to said plan.  Patient will well-appearing, afebrile in no acute distress. Worrisome signs and symptoms were discussed with the patient, and the patient acknowledged understanding to return to the ED if noticed. Patient was stable upon discharge.          Final Clinical Impression(s) / ED Diagnoses Final diagnoses:  Left-sided chest wall pain  Closed fracture of multiple ribs of left side with routine healing, subsequent encounter    Rx / DC Orders      Sherral Hammers,  Cristela Blue, [place] 05/20/23 1701    Lonell Grandchild, MD 05/20/23 2043

## 2023-05-20 NOTE — Discharge Instructions (Signed)
 As discussed, will send you home with anti-inflammatories to use for baseline pain with pain medication for breakthrough pain.  Will also send in numbing patches to use over areas of pain.  Continue to use incentive spirometry at home.  Recommend follow-up with your primary care for reassessment of your symptoms.
